# Patient Record
Sex: Male | Born: 1970 | Race: Black or African American | Hispanic: No | Smoking: Current every day smoker
Health system: Southern US, Community
[De-identification: ages and names within clinical notes are randomized; demographics above are authoritative.]

## PROBLEM LIST (undated history)

## (undated) DIAGNOSIS — E1142 Type 2 diabetes mellitus with diabetic polyneuropathy: Secondary | ICD-10-CM

## (undated) DIAGNOSIS — F5104 Psychophysiologic insomnia: Secondary | ICD-10-CM

## (undated) DIAGNOSIS — F419 Anxiety disorder, unspecified: Secondary | ICD-10-CM

## (undated) DIAGNOSIS — I10 Essential (primary) hypertension: Secondary | ICD-10-CM

## (undated) DIAGNOSIS — E785 Hyperlipidemia, unspecified: Secondary | ICD-10-CM

## (undated) DIAGNOSIS — K921 Melena: Secondary | ICD-10-CM

## (undated) DIAGNOSIS — E119 Type 2 diabetes mellitus without complications: Secondary | ICD-10-CM

## (undated) DIAGNOSIS — C801 Malignant (primary) neoplasm, unspecified: Secondary | ICD-10-CM

## (undated) DIAGNOSIS — M6281 Muscle weakness (generalized): Secondary | ICD-10-CM

## (undated) DIAGNOSIS — Z85818 Personal history of malignant neoplasm of other sites of lip, oral cavity, and pharynx: Secondary | ICD-10-CM

## (undated) DIAGNOSIS — F32A Depression, unspecified: Secondary | ICD-10-CM

## (undated) DIAGNOSIS — R413 Other amnesia: Secondary | ICD-10-CM

## (undated) DIAGNOSIS — R7989 Other specified abnormal findings of blood chemistry: Secondary | ICD-10-CM

## (undated) HISTORY — DX: Other specified abnormal findings of blood chemistry: R79.89

## (undated) HISTORY — DX: Personal history of malignant neoplasm of other sites of lip, oral cavity, and pharynx: Z85.818

## (undated) HISTORY — DX: Essential (primary) hypertension: I10

## (undated) HISTORY — DX: Other amnesia: R41.3

## (undated) HISTORY — DX: Hyperlipidemia, unspecified: E78.5

## (undated) HISTORY — PX: SKIN GRAFT: SHX250

## (undated) HISTORY — DX: Type 2 diabetes mellitus with diabetic polyneuropathy: E11.42

## (undated) HISTORY — PX: PAROTIDECTOMY: SUR1003

## (undated) HISTORY — DX: Psychophysiologic insomnia: F51.04

## (undated) HISTORY — DX: Muscle weakness (generalized): M62.81

## (undated) HISTORY — DX: Melena: K92.1

## (undated) HISTORY — PX: INCISION AND DRAINAGE DEEP NECK ABSCESS: SHX1797

---

## 2013-04-10 DIAGNOSIS — G510 Bell's palsy: Secondary | ICD-10-CM | POA: Insufficient documentation

## 2018-10-21 ENCOUNTER — Encounter (HOSPITAL_COMMUNITY): Payer: Self-pay | Admitting: Emergency Medicine

## 2018-10-21 ENCOUNTER — Emergency Department (HOSPITAL_COMMUNITY)
Admission: EM | Admit: 2018-10-21 | Discharge: 2018-10-21 | Disposition: A | Discharge status: Home / Self Care | Payer: Medicare Other | Attending: Emergency Medicine | Admitting: Emergency Medicine

## 2018-10-21 ENCOUNTER — Emergency Department (HOSPITAL_COMMUNITY): Payer: Medicare Other

## 2018-10-21 ENCOUNTER — Other Ambulatory Visit: Payer: Self-pay

## 2018-10-21 DIAGNOSIS — J101 Influenza due to other identified influenza virus with other respiratory manifestations: Secondary | ICD-10-CM

## 2018-10-21 DIAGNOSIS — Z79899 Other long term (current) drug therapy: Secondary | ICD-10-CM | POA: Insufficient documentation

## 2018-10-21 DIAGNOSIS — R509 Fever, unspecified: Secondary | ICD-10-CM | POA: Diagnosis present

## 2018-10-21 DIAGNOSIS — Z859 Personal history of malignant neoplasm, unspecified: Secondary | ICD-10-CM | POA: Diagnosis not present

## 2018-10-21 DIAGNOSIS — J111 Influenza due to unidentified influenza virus with other respiratory manifestations: Secondary | ICD-10-CM | POA: Diagnosis not present

## 2018-10-21 HISTORY — DX: Malignant (primary) neoplasm, unspecified: C80.1

## 2018-10-21 LAB — CBC WITH DIFFERENTIAL/PLATELET
Abs Immature Granulocytes: 0.03 10*3/uL (ref 0.00–0.07)
Basophils Absolute: 0 10*3/uL (ref 0.0–0.1)
Basophils Relative: 0 %
Eosinophils Absolute: 0 10*3/uL (ref 0.0–0.5)
Eosinophils Relative: 1 %
HCT: 41.7 % (ref 39.0–52.0)
Hemoglobin: 13.6 g/dL (ref 13.0–17.0)
Immature Granulocytes: 1 %
Lymphocytes Relative: 5 %
Lymphs Abs: 0.3 10*3/uL — ABNORMAL LOW (ref 0.7–4.0)
MCH: 28.8 pg (ref 26.0–34.0)
MCHC: 32.6 g/dL (ref 30.0–36.0)
MCV: 88.3 fL (ref 80.0–100.0)
Monocytes Absolute: 0.6 10*3/uL (ref 0.1–1.0)
Monocytes Relative: 10 %
Neutro Abs: 5.3 10*3/uL (ref 1.7–7.7)
Neutrophils Relative %: 83 %
Platelets: 173 10*3/uL (ref 150–400)
RBC: 4.72 MIL/uL (ref 4.22–5.81)
RDW: 12.1 % (ref 11.5–15.5)
WBC: 6.3 10*3/uL (ref 4.0–10.5)
nRBC: 0 % (ref 0.0–0.2)

## 2018-10-21 LAB — HEPATIC FUNCTION PANEL
ALT: 51 U/L — ABNORMAL HIGH (ref 0–44)
AST: 48 U/L — ABNORMAL HIGH (ref 15–41)
Albumin: 3.5 g/dL (ref 3.5–5.0)
Alkaline Phosphatase: 66 U/L (ref 38–126)
Bilirubin, Direct: 0.1 mg/dL (ref 0.0–0.2)
Indirect Bilirubin: 0.6 mg/dL (ref 0.3–0.9)
Total Bilirubin: 0.7 mg/dL (ref 0.3–1.2)
Total Protein: 6.9 g/dL (ref 6.5–8.1)

## 2018-10-21 LAB — BASIC METABOLIC PANEL
Anion gap: 9 (ref 5–15)
BUN: 9 mg/dL (ref 6–20)
CO2: 24 mmol/L (ref 22–32)
Calcium: 8.7 mg/dL — ABNORMAL LOW (ref 8.9–10.3)
Chloride: 104 mmol/L (ref 98–111)
Creatinine, Ser: 1.18 mg/dL (ref 0.61–1.24)
GFR calc Af Amer: >60 mL/min (ref >60)
GFR calc non Af Amer: >60 mL/min (ref >60)
Glucose, Bld: 206 mg/dL — ABNORMAL HIGH (ref 70–99)
Potassium: 4.1 mmol/L (ref 3.5–5.1)
Sodium: 137 mmol/L (ref 135–145)

## 2018-10-21 LAB — INFLUENZA PANEL BY PCR (TYPE A & B)
Influenza A By PCR: POSITIVE — AB
Influenza B By PCR: NEGATIVE

## 2018-10-21 LAB — GROUP A STREP BY PCR: Group A Strep by PCR: NOT DETECTED

## 2018-10-21 LAB — LIPASE, BLOOD: Lipase: 25 U/L (ref 11–51)

## 2018-10-21 MED ORDER — KETOROLAC TROMETHAMINE 30 MG/ML IJ SOLN
30.0000 mg | Freq: Once | INTRAMUSCULAR | Status: AC
  Administered 2018-10-21: 30 mg via INTRAVENOUS
  Filled 2018-10-21: qty 1

## 2018-10-21 MED ORDER — BENZONATATE 100 MG PO CAPS: 100.0000 mg | ORAL_CAPSULE | Freq: Three times a day (TID) | ORAL | 0 refills | Status: DC | PRN

## 2018-10-21 MED ORDER — SODIUM CHLORIDE 0.9 % IV BOLUS
500.0000 mL | Freq: Once | INTRAVENOUS | Status: AC
  Administered 2018-10-21: 500 mL via INTRAVENOUS

## 2018-10-21 MED ORDER — OSELTAMIVIR PHOSPHATE 75 MG PO CAPS: 75.0000 mg | ORAL_CAPSULE | Freq: Two times a day (BID) | ORAL | 0 refills | Status: DC

## 2018-10-21 MED ORDER — SODIUM CHLORIDE 0.9 % IV BOLUS
1000.0000 mL | Freq: Once | INTRAVENOUS | Status: AC
  Administered 2018-10-21: 1000 mL via INTRAVENOUS

## 2018-10-21 MED ORDER — ONDANSETRON HCL 4 MG/2ML IJ SOLN
4.0000 mg | Freq: Once | INTRAMUSCULAR | Status: AC
  Administered 2018-10-21: 4 mg via INTRAVENOUS
  Filled 2018-10-21: qty 2

## 2018-10-21 MED ORDER — ONDANSETRON 4 MG PO TBDP: 4.0000 mg | ORAL_TABLET | Freq: Three times a day (TID) | ORAL | 0 refills | Status: AC | PRN

## 2018-10-21 MED ORDER — FLUTICASONE PROPIONATE 50 MCG/ACT NA SUSP: 2.0000 | Freq: Every day | NASAL | 0 refills | Status: DC

## 2018-10-21 NOTE — ED Triage Notes (Signed)
Pt c/o sore throat, cough, sinus drainage, and chills x 2 days. Reports his of cancer but cant not recall what kind. No other PMH

## 2018-10-21 NOTE — ED Notes (Signed)
Additional complaints of lower back pain and N/V x3 that began yesterday.

## 2018-10-21 NOTE — ED Notes (Signed)
Patient transported to X-ray 

## 2018-10-21 NOTE — ED Notes (Signed)
Patient verbalizes understanding of discharge instructions. Opportunity for questioning and answers were provided. Armband removed by staff, pt discharged from ED.  

## 2018-10-21 NOTE — Discharge Instructions (Signed)
Take Tamiflu twice daily as prescribed.  Stop taking this medicine if you experience any significant side effects including nausea, vomiting, mood disturbances.  Plenty water get plenty of rest.  Gargle warm salt water and spit it out for sore throat. May also use cough drops, warm teas, etc. Take flonase to decrease nasal congestion.  Tessalon as needed for cough. You can take 600mg  ibuprofen every 6 hours as needed for fever/pain.  Take ibuprofen with food to avoid upset stomach issues.   Followup with your primary care doctor in 3-5 days for recheck of ongoing symptoms. Return to emergency department for emergent changing or worsening of symptoms such as throat tightness, facial swelling, fever not controlled by ibuprofen or Tylenol,difficulty breathing, or chest pain.

## 2018-10-21 NOTE — ED Notes (Signed)
Pt able to ingest fluids and food without complaint.

## 2018-10-21 NOTE — ED Provider Notes (Signed)
[facility] EMERGENCY DEPARTMENT Provider Note   CSN: [id number] Arrival date & time: 10/21/18  1603     History   Chief Complaint Chief Complaint  Patient presents with  . Influenza    HPI Jimmy Chandler is a 48 y.o. male presents for evaluation of acute onset, persistent flulike symptoms for 2 days.  Reports subjective fevers and chills, generalized myalgias, nasal congestion, sore throat, and cough productive of "beige "sputum.  Denies shortness of breath but does report some midline aching chest pain with cough only.  Developed nausea and vomiting and has had experienced 3 episodes of nonbloody nonbilious emesis yesterday.  None today.  Denies abdominal pain.  Does note some aching right-sided low back pain developing yesterday as well.  Denies numbness to the extremities but does report generalized weakness.  Denies bowel or bladder incontinence, saddle anesthesia, IV drug use.  Denies leg swelling, recent travel or surgery, hemoptysis, or prior history of DVT or PE.  Does have a history of cancer and states it was in his "bone", but states that he has been cancer free for several years.  Underwent treatment in [place] which included surgical excision of lymph nodes and some surgery on his jaw.  Has not been able to follow-up with an oncologist here. Has tried rest and soup with some relief. He is a nonsmoker.   The history is provided by the patient.    Past Medical History:  Diagnosis Date  . Cancer ([place])     There are no active problems to display for this patient.   History reviewed. No pertinent surgical history.      Home Medications    Prior to Admission medications   Medication Sig Start Date End Date Taking? Authorizing Provider  benzonatate (TESSALON) 100 MG capsule Take 1 capsule (100 mg total) by mouth 3 (three) times daily as needed for cough. 10/21/18   Jen Benedict A, PA-C  fluticasone (FLONASE) 50 MCG/ACT nasal spray Place 2 sprays into  both nostrils daily. 10/21/18   Nils Flack, Asmi Fugere A, PA-C  ondansetron (ZOFRAN ODT) 4 MG disintegrating tablet Take 1 tablet (4 mg total) by mouth every 8 (eight) hours as needed for up to 3 days for nausea or vomiting. 10/21/18 10/24/18  Rodell Perna A, PA-C  oseltamivir (TAMIFLU) 75 MG capsule Take 1 capsule (75 mg total) by mouth every 12 (twelve) hours. 10/21/18   Renita Papa, PA-C    Family History No family history on file.  Social History Social History   Tobacco Use  . Smoking status: Not on file  Substance Use Topics  . Alcohol use: Not on file  . Drug use: Not on file     Allergies   Patient has no known allergies.   Review of Systems Review of Systems  Constitutional: Positive for chills, fatigue and fever.  HENT: Positive for congestion, rhinorrhea and sore throat.   Respiratory: Positive for cough. Negative for shortness of breath.   Cardiovascular: Negative for chest pain (with cough only).  Gastrointestinal: Positive for nausea and vomiting. Negative for abdominal pain, constipation and diarrhea.  Genitourinary: Negative for dysuria, frequency, hematuria and urgency.  Musculoskeletal: Positive for myalgias.  All other systems reviewed and are negative.    Physical Exam Updated Vital Signs BP (!) 130/58   Pulse (!) 101   Temp 99.8 F (37.7 C) (Oral)   Resp 18   Ht 5\' 9"  (1.753 m)   Wt 99.8 kg   SpO2 99%  BMI 32.49 kg/m   Physical Exam Vitals signs and nursing note reviewed.  Constitutional:      General: He is not in acute distress.    Appearance: Normal appearance. He is well-developed.  HENT:     Head: Normocephalic and atraumatic.     Right Ear: Tympanic membrane, ear canal and external ear normal.     Left Ear: Tympanic membrane, ear canal and external ear normal.     Nose: Congestion present.     Comments: No frontal or maxillary sinus tenderness    Mouth/Throat:     Mouth: Mucous membranes are moist.     Pharynx: No oropharyngeal exudate or  posterior oropharyngeal erythema.     Comments: Postsurgical changes noted.  Tolerating secretions without difficulty. Eyes:     General:        Right eye: No discharge.        Left eye: No discharge.     Conjunctiva/sclera: Conjunctivae normal.  Neck:     Musculoskeletal: Normal range of motion and neck supple. No neck rigidity or muscular tenderness.     Vascular: No JVD.     Trachea: No tracheal deviation.     Comments: Well-healed surgical scar from prior lymphadenectomy to left anterior neck Cardiovascular:     Rate and Rhythm: Tachycardia present.     Pulses: Normal pulses.     Heart sounds: Normal heart sounds.     Comments: 2+ radial and DP/PT pulses bilaterally, Homans sign absent bilaterally, no lower extremity edema, no palpable cords, compartments are soft  Pulmonary:     Effort: Pulmonary effort is normal. No respiratory distress.     Breath sounds: Normal breath sounds. No wheezing.  Abdominal:     General: Abdomen is flat. Bowel sounds are normal. There is no distension.     Tenderness: There is no abdominal tenderness. There is no right CVA tenderness, left CVA tenderness, guarding or rebound.  Musculoskeletal: Normal range of motion.     Comments: No midline spine tenderness, right paralumbar muscle tenderness.  No deformity, crepitus, or step-off noted.  5/5 strength of BUE and BLE major muscle groups  Lymphadenopathy:     Cervical: No cervical adenopathy.  Skin:    General: Skin is warm and dry.     Findings: No erythema.  Neurological:     General: No focal deficit present.     Mental Status: He is alert.     Comments: Fluent speech, no facial droop, sensation intact to bilateral lower extremities  Psychiatric:        Behavior: Behavior normal.      ED Treatments / Results  Labs (all labs ordered are listed, but only abnormal results are displayed) Labs Reviewed  BASIC METABOLIC PANEL - Abnormal; Notable for the following components:      Result Value    Glucose, Bld 206 (*)    Calcium 8.7 (*)    All other components within normal limits  CBC WITH DIFFERENTIAL/PLATELET - Abnormal; Notable for the following components:   Lymphs Abs 0.3 (*)    All other components within normal limits  INFLUENZA PANEL BY PCR (TYPE A & B) - Abnormal; Notable for the following components:   Influenza A By PCR POSITIVE (*)    All other components within normal limits  HEPATIC FUNCTION PANEL - Abnormal; Notable for the following components:   AST 48 (*)    ALT 51 (*)    All other components within normal limits  GROUP  A STREP BY PCR  LIPASE, BLOOD    EKG None  Radiology Dg Chest 2 View  Result Date: 10/21/2018 CLINICAL DATA:  Chest pain, productive cough. EXAM: CHEST - 2 VIEW COMPARISON:  None. FINDINGS: The heart size and mediastinal contours are within normal limits. Both lungs are clear. No pneumothorax or pleural effusion is noted. The visualized skeletal structures are unremarkable. IMPRESSION: No active cardiopulmonary disease. Electronically Signed   By: Marijo Conception, M.D.   On: 10/21/2018 18:47    Procedures Procedures (including critical care time)  Medications Ordered in ED Medications  sodium chloride 0.9 % bolus 1,000 mL (0 mLs Intravenous Stopped 10/21/18 2232)  ondansetron (ZOFRAN) injection 4 mg (4 mg Intravenous Given 10/21/18 1808)  sodium chloride 0.9 % bolus 500 mL (0 mLs Intravenous Stopped 10/21/18 2232)  ketorolac (TORADOL) 30 MG/ML injection 30 mg (30 mg Intravenous Given 10/21/18 2158)     Initial Impression / Assessment and Plan / ED Course  I have reviewed the triage vital signs and the nursing notes.  Pertinent labs & imaging results that were available during my care of the patient were reviewed by me and considered in my medical decision making (see chart for details).     Patient presenting for evaluation of flulike symptoms for 2 days.  He is afebrile, initially tachycardic with improvement on reevaluation.  Overall  resting comfortably.  Abdomen is soft and nontender.  Lab work reviewed by me shows no leukocytosis, no anemia, no metabolic derangements.  He is hyperglycemic with a glucose of 206.  His LFTs are mildly elevated however he has no right upper quadrant tenderness on examination so I am unsure of the clinical significance.  Doubt acute surgical abdominal pathology given benign examination.  He is positive for influenza A.  Chest x-ray shows no acute cardiopulmonary abnormalities, no evidence of pneumonia or pleural effusion.  He was given IV fluids and Zofran in the ED and on reevaluation is resting comfortably in no apparent distress.  Serial abdominal examinations remain benign.  Doubt PE, ACS/MI, dissection, or pneumothorax.  Symptoms more infectious in etiology.  Discussed that he is within the window to receive Tamiflu and he would like to proceed with Tamiflu treatment.  Discussed side effects risks and benefits and possible expense of the medication.  He verbalized understanding of and agreement with this and would like to proceed with Tamiflu treatment.  Will discharge with symptomatic treatment as well.  Recommend follow-up with PCP for reevaluation of symptoms.  Discussed strict ED return precautions. Pt verbalized understanding of and agreement with plan and is safe for discharge home at this time.   Final Clinical Impressions(s) / ED Diagnoses   Final diagnoses:  Influenza A    ED Discharge Orders         Ordered    oseltamivir (TAMIFLU) 75 MG capsule  Every 12 hours     10/21/18 2235    ondansetron (ZOFRAN ODT) 4 MG disintegrating tablet  Every 8 hours PRN     10/21/18 2235    fluticasone (FLONASE) 50 MCG/ACT nasal spray  Daily     10/21/18 2235    benzonatate (TESSALON) 100 MG capsule  3 times daily PRN     10/21/18 2235           Renita Papa, PA-C 10/21/18 2257    Carmin Muskrat, MD 10/22/18 2313

## 2019-03-09 ENCOUNTER — Other Ambulatory Visit: Payer: Self-pay

## 2019-03-09 ENCOUNTER — Emergency Department (HOSPITAL_COMMUNITY)
Admission: EM | Admit: 2019-03-09 | Discharge: 2019-03-10 | Disposition: A | Discharge status: Home / Self Care | Payer: Medicare PPO | Attending: Emergency Medicine | Admitting: Emergency Medicine

## 2019-03-09 ENCOUNTER — Encounter (HOSPITAL_COMMUNITY): Payer: Self-pay | Admitting: Emergency Medicine

## 2019-03-09 ENCOUNTER — Emergency Department (HOSPITAL_COMMUNITY): Payer: Medicare PPO

## 2019-03-09 DIAGNOSIS — R1084 Generalized abdominal pain: Secondary | ICD-10-CM

## 2019-03-09 DIAGNOSIS — R079 Chest pain, unspecified: Secondary | ICD-10-CM | POA: Diagnosis present

## 2019-03-09 DIAGNOSIS — E119 Type 2 diabetes mellitus without complications: Secondary | ICD-10-CM | POA: Diagnosis not present

## 2019-03-09 DIAGNOSIS — R0789 Other chest pain: Secondary | ICD-10-CM | POA: Diagnosis not present

## 2019-03-09 LAB — HEPATIC FUNCTION PANEL
ALT: 52 U/L — ABNORMAL HIGH (ref 0–44)
AST: 40 U/L (ref 15–41)
Albumin: 3.5 g/dL (ref 3.5–5.0)
Alkaline Phosphatase: 91 U/L (ref 38–126)
Bilirubin, Direct: 0.2 mg/dL (ref 0.0–0.2)
Indirect Bilirubin: 0.6 mg/dL (ref 0.3–0.9)
Total Bilirubin: 0.8 mg/dL (ref 0.3–1.2)
Total Protein: 6.5 g/dL (ref 6.5–8.1)

## 2019-03-09 LAB — LIPASE, BLOOD: Lipase: 30 U/L (ref 11–51)

## 2019-03-09 LAB — BASIC METABOLIC PANEL
Anion gap: 11 (ref 5–15)
BUN: 8 mg/dL (ref 6–20)
CO2: 23 mmol/L (ref 22–32)
Calcium: 8.9 mg/dL (ref 8.9–10.3)
Chloride: 96 mmol/L — ABNORMAL LOW (ref 98–111)
Creatinine, Ser: 0.98 mg/dL (ref 0.61–1.24)
GFR calc Af Amer: >60 mL/min (ref >60)
GFR calc non Af Amer: >60 mL/min (ref >60)
Glucose, Bld: 403 mg/dL — ABNORMAL HIGH (ref 70–99)
Potassium: 3.8 mmol/L (ref 3.5–5.1)
Sodium: 130 mmol/L — ABNORMAL LOW (ref 135–145)

## 2019-03-09 LAB — CBC
HCT: 41.1 % (ref 39.0–52.0)
Hemoglobin: 13.9 g/dL (ref 13.0–17.0)
MCH: 29.4 pg (ref 26.0–34.0)
MCHC: 33.8 g/dL (ref 30.0–36.0)
MCV: 87.1 fL (ref 80.0–100.0)
Platelets: 227 10*3/uL (ref 150–400)
RBC: 4.72 MIL/uL (ref 4.22–5.81)
RDW: 11.4 % — ABNORMAL LOW (ref 11.5–15.5)
WBC: 5.6 10*3/uL (ref 4.0–10.5)
nRBC: 0 % (ref 0.0–0.2)

## 2019-03-09 LAB — TROPONIN I (HIGH SENSITIVITY)
Troponin I (High Sensitivity): 7 ng/L (ref <18)
Troponin I (High Sensitivity): 5 ng/L (ref <18)

## 2019-03-09 LAB — D-DIMER, QUANTITATIVE: D-Dimer, Quant: <0.27 ug/mL-FEU (ref 0.00–0.50)

## 2019-03-09 MED ORDER — SODIUM CHLORIDE 0.9 % IV BOLUS
1000.0000 mL | Freq: Once | INTRAVENOUS | Status: AC
  Administered 2019-03-09: 1000 mL via INTRAVENOUS

## 2019-03-09 MED ORDER — SODIUM CHLORIDE 0.9% FLUSH
3.0000 mL | Freq: Once | INTRAVENOUS | Status: AC
  Administered 2019-03-09: 3 mL via INTRAVENOUS

## 2019-03-09 NOTE — ED Provider Notes (Signed)
[facility] EMERGENCY DEPARTMENT Provider Note   CSN: [id number] Arrival date & time: 03/09/19  1736    History   Chief Complaint Chief Complaint  Patient presents with  . Chest Pain    HPI Jimmy Chandler is a 48 y.o. male with a past medical history of neck cancer who presents today for evaluation of multiple complaints. 1.  Chest pain he reports central, waxing and waning chest pain over the past 3 to 4 days.  He denies any radiation.  No aggravating or alleviating factors noted.  He has not had similar pain before.  He reports sore throat and occasional cough with shortness of breath.  He denies any fevers or chills.  No known sick contacts.  He denies any palpitations.  He reports that he feels like both of his legs are slightly swollen symmetrically.  No recent hemoptysis.    2.  Abdominal pain, nausea, vomiting, diarrhea: He reports that over the past 2 days he has had abdominal pain that comes and goes.  He is not having any pain unless his abdomen is pressed on.  He reports that he has not had any nausea or vomiting today.  He states that yesterday he vomited 2 or 3 times.  Today he has had 1 loose stool.  He denies any blood in his vomit or bowel movement.  He states that he has been able to eat and drink.  No dysuria, increased frequency or urgency.      HPI  Past Medical History:  Diagnosis Date  . Cancer ([place])     There are no active problems to display for this patient.   History reviewed. No pertinent surgical history.      Home Medications    Prior to Admission medications   Medication Sig Start Date End Date Taking? Authorizing Provider  benzonatate (TESSALON) 100 MG capsule Take 1 capsule (100 mg total) by mouth 3 (three) times daily as needed for cough. Patient not taking: Reported on 03/09/2019 10/21/18   Rodell Perna A, PA-C  fluticasone (FLONASE) 50 MCG/ACT nasal spray Place 2 sprays into both nostrils daily. Patient not taking: Reported  on 03/09/2019 10/21/18   Rodell Perna A, PA-C  metFORMIN (GLUCOPHAGE) 500 MG tablet Take 1 tablet (500 mg total) by mouth 2 (two) times daily with a meal for 30 days. 03/10/19 04/09/19  Lorin Glass, PA-C  oseltamivir (TAMIFLU) 75 MG capsule Take 1 capsule (75 mg total) by mouth every 12 (twelve) hours. Patient not taking: Reported on 03/09/2019 10/21/18   Renita Papa, PA-C    Family History No family history on file.  Social History Social History   Tobacco Use  . Smoking status: Never Smoker  . Smokeless tobacco: Never Used  Substance Use Topics  . Alcohol use: Yes  . Drug use: Never     Allergies   Patient has no known allergies.   Review of Systems Review of Systems  Constitutional: Negative for chills and fever.  HENT: Positive for mouth sores. Negative for ear pain and sore throat.   Eyes: Negative for pain and visual disturbance.  Respiratory: Positive for cough and shortness of breath.   Cardiovascular: Positive for chest pain and leg swelling. Negative for palpitations.  Gastrointestinal: Positive for abdominal pain, diarrhea, nausea and vomiting.  Musculoskeletal: Negative for arthralgias and back pain.  Skin: Negative for color change and rash.  Neurological: Negative for weakness and headaches.  All other systems reviewed and are negative.  Physical Exam Updated Vital Signs BP 112/77   Pulse 80   Temp 97.8 F (36.6 C) (Oral)   Resp 15   SpO2 96%   Physical Exam Vitals signs and nursing note reviewed.  Constitutional:      General: He is not in acute distress.    Appearance: He is well-developed. He is not diaphoretic.  HENT:     Head: Normocephalic and atraumatic.  Eyes:     General: No scleral icterus.       Right eye: No discharge.        Left eye: No discharge.     Conjunctiva/sclera: Conjunctivae normal.  Neck:     Musculoskeletal: Normal range of motion.  Cardiovascular:     Rate and Rhythm: Normal rate and regular rhythm.      Pulses:          Radial pulses are 2+ on the right side and 2+ on the left side.       Dorsalis pedis pulses are 1+ on the right side and 2+ on the left side.     Heart sounds: Normal heart sounds. No murmur.  Pulmonary:     Effort: Pulmonary effort is normal. No respiratory distress.     Breath sounds: Normal breath sounds. No stridor. No decreased breath sounds or wheezing.  Chest:     Chest wall: No mass, deformity, tenderness or crepitus.  Abdominal:     General: Bowel sounds are normal. There is no distension.     Palpations: Abdomen is soft.     Tenderness: There is abdominal tenderness in the left lower quadrant.  Musculoskeletal:        General: No deformity.  Skin:    General: Skin is warm and dry.  Neurological:     General: No focal deficit present.     Mental Status: He is alert.     Motor: No abnormal muscle tone.  Psychiatric:        Mood and Affect: Mood normal.        Behavior: Behavior normal.      ED Treatments / Results  Labs (all labs ordered are listed, but only abnormal results are displayed) Labs Reviewed  BASIC METABOLIC PANEL - Abnormal; Notable for the following components:      Result Value   Sodium 130 (*)    Chloride 96 (*)    Glucose, Bld 403 (*)    All other components within normal limits  CBC - Abnormal; Notable for the following components:   RDW 11.4 (*)    All other components within normal limits  HEPATIC FUNCTION PANEL - Abnormal; Notable for the following components:   ALT 52 (*)    All other components within normal limits  TROPONIN I (HIGH SENSITIVITY)  TROPONIN I (HIGH SENSITIVITY)  LIPASE, BLOOD  D-DIMER, QUANTITATIVE (NOT AT [facility])  URINALYSIS, ROUTINE W REFLEX MICROSCOPIC    EKG None  Radiology Dg Chest 2 View  Result Date: 03/09/2019 CLINICAL DATA:  Chest pain EXAM: CHEST - 2 VIEW COMPARISON:  10/21/2018 FINDINGS: The heart size and mediastinal contours are within normal limits. Both lungs are clear. The visualized  skeletal structures are unremarkable. IMPRESSION: No active cardiopulmonary disease. Electronically Signed   By: Constance Holster M.D.   On: 03/09/2019 19:18    Procedures Procedures (including critical care time)  Medications Ordered in ED Medications  sodium chloride flush (NS) 0.9 % injection 3 mL (3 mLs Intravenous Given 03/09/19 2346)  sodium chloride 0.9 %  bolus 1,000 mL (1,000 mLs Intravenous New Bag/Given 03/09/19 2346)     Initial Impression / Assessment and Plan / ED Course  I have reviewed the triage vital signs and the nursing notes.  Pertinent labs & imaging results that were available during my care of the patient were reviewed by me and considered in my medical decision making (see chart for details).       Patient presents today for evaluation of multiple complaints. 1.  Chest pain.  His chest pain has been going on for 4 days and is in the middle of his chest.  High-sensitivity troponin is not elevated, EKG does not show evidence of ischemia.  X-ray without evidence of acute abnormality.  Do not suspect ACS.  He was not slightly tachycardic on arrival, d-dimer was obtained which was not elevated, suspect GERD versus musculoskeletal pain.  2 abdominal pain, nausea, vomiting, diarrhea.  Labs are obtained and reviewed.  He does have mild abdominal tenderness however refused recommended CT scan of his abdomen after being given enough information to make an informed decision about his health.    ALT is slightly elevated at 52, lipase is not elevated.  Suspect gastroenteritis, most likely viral however this is limited based on absence of CT scan.  3.  New diagnosis of diabetes Patient's sugar was 403 today.  He has previously been told that he was prediabetic however he is informed of the new diagnosis of diabetes.  We will start him on metformin 500 mg twice daily.  He is given a scription for 30 days worth.  He does not have a primary care doctor, and the importance of  obtaining appropriate primary care doctor and following up was emphasized to patient.  He is given 1 L of IV fluids.  His CO2 is not significantly low, do not suspect DKA. He has Medicare so he should not have significant difficulty in finding a primary care doctor.  This patient was seen as a shared visit with Dr. Kathrynn Humble.  At shift change care was transferred to Quincy Carnes PA-C who will follow pending studies, re-evaulate and determine disposition.      Final Clinical Impressions(s) / ED Diagnoses   Final diagnoses:  Atypical chest pain  Generalized abdominal pain  Diabetes mellitus, new onset [facility])    ED Discharge Orders         Ordered    metFORMIN (GLUCOPHAGE) 500 MG tablet  2 times daily with meals     03/10/19 0005           Lorin Glass, PA-C 03/10/19 0037    Varney Biles, MD 03/10/19 1228

## 2019-03-09 NOTE — ED Triage Notes (Signed)
Patient reports intermittent central, non-radiating chest pain and sore throat since last night. Denies shortness of breath, dizziness, N/V, cough, fevers/chills. Resp e/u, skin w/d.

## 2019-03-10 DIAGNOSIS — R0789 Other chest pain: Secondary | ICD-10-CM | POA: Diagnosis not present

## 2019-03-10 LAB — URINALYSIS, ROUTINE W REFLEX MICROSCOPIC
Bacteria, UA: NONE SEEN
Bilirubin Urine: NEGATIVE
Glucose, UA: >=500 mg/dL — AB
Hgb urine dipstick: NEGATIVE
Ketones, ur: 5 mg/dL — AB
Leukocytes,Ua: NEGATIVE
Nitrite: NEGATIVE
Protein, ur: NEGATIVE mg/dL
Specific Gravity, Urine: 1.035 — ABNORMAL HIGH (ref 1.005–1.030)
pH: 5 (ref 5.0–8.0)

## 2019-03-10 LAB — CBG MONITORING, ED: Glucose-Capillary: 250 mg/dL — ABNORMAL HIGH (ref 70–99)

## 2019-03-10 MED ORDER — LIDOCAINE VISCOUS HCL 2 % MT SOLN
15.0000 mL | Freq: Once | OROMUCOSAL | Status: AC
  Administered 2019-03-10: 15 mL via ORAL
  Filled 2019-03-10: qty 15

## 2019-03-10 MED ORDER — METFORMIN HCL 500 MG PO TABS: 500.0000 mg | ORAL_TABLET | Freq: Two times a day (BID) | ORAL | 0 refills | Status: DC

## 2019-03-10 MED ORDER — ALUM & MAG HYDROXIDE-SIMETH 200-200-20 MG/5ML PO SUSP
30.0000 mL | Freq: Once | ORAL | Status: AC
  Administered 2019-03-10: 30 mL via ORAL
  Filled 2019-03-10: qty 30

## 2019-03-10 NOTE — ED Notes (Signed)
Patient verbalizes understanding of discharge instructions. Opportunity for questioning and answers were provided. Armband removed by staff, pt discharged from ED.  

## 2019-03-10 NOTE — Discharge Instructions (Signed)
As we discussed today your blood sugar was significantly elevated meaning you have a new diagnosis of diabetes.  I have given you information about diabetes and nutrition, and diabetes and exercise.  It is important that you establish care with a primary care doctor as soon as possible for further evaluation and management.  I have given you a prescription today for metformin.  This is a medicine that can help lower your blood sugar.  Abdominal pain, diarrhea are to very common side effects.    If your chest pain changes or worsens or you have additional concerns please seek additional medical care and evaluation.

## 2019-03-10 NOTE — ED Provider Notes (Signed)
Assumed care from [place] at shift change.  Briefly, 48 year old male here with multiple concerns.  Chest pain, abdominal pain, new onset diabetes.  Labs overall reassuring.  He was offered CT scan and refused.  Cardiac work-up was negative including troponin and d-dimer.  CBG here in the 400s.  He was given IV fluids.  Plan: IV fluids infusing, recheck CBG afterwards.  Prescriptions and discharge paperwork prepared for patient pending reassessment.  Results for orders placed or performed during the hospital encounter of 40/10/27  Basic metabolic panel  Result Value Ref Range   Sodium 130 (L) 135 - 145 mmol/L   Potassium 3.8 3.5 - 5.1 mmol/L   Chloride 96 (L) 98 - 111 mmol/L   CO2 23 22 - 32 mmol/L   Glucose, Bld 403 (H) 70 - 99 mg/dL   BUN 8 6 - 20 mg/dL   Creatinine, Ser 0.98 0.61 - 1.24 mg/dL   Calcium 8.9 8.9 - 10.3 mg/dL   GFR calc non Af Amer >60 >60 mL/min   GFR calc Af Amer >60 >60 mL/min   Anion gap 11 5 - 15  CBC  Result Value Ref Range   WBC 5.6 4.0 - 10.5 K/uL   RBC 4.72 4.22 - 5.81 MIL/uL   Hemoglobin 13.9 13.0 - 17.0 g/dL   HCT 41.1 39.0 - 52.0 %   MCV 87.1 80.0 - 100.0 fL   MCH 29.4 26.0 - 34.0 pg   MCHC 33.8 30.0 - 36.0 g/dL   RDW 11.4 (L) 11.5 - 15.5 %   Platelets 227 150 - 400 K/uL   nRBC 0.0 0.0 - 0.2 %  Troponin I (High Sensitivity)  Result Value Ref Range   Troponin I (High Sensitivity) 7 <18 ng/L  Troponin I (High Sensitivity)  Result Value Ref Range   Troponin I (High Sensitivity) 5 <18 ng/L  Hepatic function panel  Result Value Ref Range   Total Protein 6.5 6.5 - 8.1 g/dL   Albumin 3.5 3.5 - 5.0 g/dL   AST 40 15 - 41 U/L   ALT 52 (H) 0 - 44 U/L   Alkaline Phosphatase 91 38 - 126 U/L   Total Bilirubin 0.8 0.3 - 1.2 mg/dL   Bilirubin, Direct 0.2 0.0 - 0.2 mg/dL   Indirect Bilirubin 0.6 0.3 - 0.9 mg/dL  Lipase, blood  Result Value Ref Range   Lipase 30 11 - 51 U/L  D-dimer, quantitative (not at [facility])  Result Value Ref Range   D-Dimer, Quant  <0.27 0.00 - 0.50 ug/mL-FEU  Urinalysis, Routine w reflex microscopic  Result Value Ref Range   Color, Urine YELLOW YELLOW   APPearance CLEAR CLEAR   Specific Gravity, Urine 1.035 (H) 1.005 - 1.030   pH 5.0 5.0 - 8.0   Glucose, UA >=500 (A) NEGATIVE mg/dL   Hgb urine dipstick NEGATIVE NEGATIVE   Bilirubin Urine NEGATIVE NEGATIVE   Ketones, ur 5 (A) NEGATIVE mg/dL   Protein, ur NEGATIVE NEGATIVE mg/dL   Nitrite NEGATIVE NEGATIVE   Leukocytes,Ua NEGATIVE NEGATIVE   RBC / HPF 0-5 0 - 5 RBC/hpf   WBC, UA 0-5 0 - 5 WBC/hpf   Bacteria, UA NONE SEEN NONE SEEN  POC CBG, ED  Result Value Ref Range   Glucose-Capillary 250 (H) 70 - 99 mg/dL   Dg Chest 2 View  Result Date: 03/09/2019 CLINICAL DATA:  Chest pain EXAM: CHEST - 2 VIEW COMPARISON:  10/21/2018 FINDINGS: The heart size and mediastinal contours are within normal limits. Both  lungs are clear. The visualized skeletal structures are unremarkable. IMPRESSION: No active cardiopulmonary disease. Electronically Signed   By: Constance Holster M.D.   On: 03/09/2019 19:18     1:57 AM CBG after IV fluids is decreased down to 250.  His CO2 is normal and minimal ketones present in the urine.  This is not consistent with DKA.  Feel patient is stable for discharge home with prescriptions as per PA Phylliss Bob, start metformin.  Will need close follow-up with PCP.  Return here for any new or acute changes.   Larene Pickett, PA-C 03/10/19 2778    Ezequiel Essex, MD 03/10/19 0930

## 2019-04-13 ENCOUNTER — Emergency Department (HOSPITAL_COMMUNITY): Payer: Medicare PPO

## 2019-04-13 ENCOUNTER — Emergency Department (HOSPITAL_COMMUNITY)
Admission: EM | Admit: 2019-04-13 | Discharge: 2019-04-14 | Disposition: A | Discharge status: Home / Self Care | Payer: Medicare PPO | Attending: Emergency Medicine | Admitting: Emergency Medicine

## 2019-04-13 ENCOUNTER — Encounter (HOSPITAL_COMMUNITY): Payer: Self-pay

## 2019-04-13 ENCOUNTER — Other Ambulatory Visit: Payer: Self-pay

## 2019-04-13 DIAGNOSIS — E11628 Type 2 diabetes mellitus with other skin complications: Secondary | ICD-10-CM | POA: Insufficient documentation

## 2019-04-13 DIAGNOSIS — Z85828 Personal history of other malignant neoplasm of skin: Secondary | ICD-10-CM | POA: Diagnosis not present

## 2019-04-13 DIAGNOSIS — L0889 Other specified local infections of the skin and subcutaneous tissue: Secondary | ICD-10-CM | POA: Diagnosis not present

## 2019-04-13 DIAGNOSIS — L089 Local infection of the skin and subcutaneous tissue, unspecified: Secondary | ICD-10-CM

## 2019-04-13 HISTORY — DX: Type 2 diabetes mellitus without complications: E11.9

## 2019-04-13 LAB — BASIC METABOLIC PANEL
Anion gap: 13 (ref 5–15)
BUN: 18 mg/dL (ref 6–20)
CO2: 19 mmol/L — ABNORMAL LOW (ref 22–32)
Calcium: 8.8 mg/dL — ABNORMAL LOW (ref 8.9–10.3)
Chloride: 98 mmol/L (ref 98–111)
Creatinine, Ser: 1.12 mg/dL (ref 0.61–1.24)
GFR calc Af Amer: >60 mL/min (ref >60)
GFR calc non Af Amer: >60 mL/min (ref >60)
Glucose, Bld: 360 mg/dL — ABNORMAL HIGH (ref 70–99)
Potassium: 3.9 mmol/L (ref 3.5–5.1)
Sodium: 130 mmol/L — ABNORMAL LOW (ref 135–145)

## 2019-04-13 LAB — CBC
HCT: 42.9 % (ref 39.0–52.0)
Hemoglobin: 14.5 g/dL (ref 13.0–17.0)
MCH: 30 pg (ref 26.0–34.0)
MCHC: 33.8 g/dL (ref 30.0–36.0)
MCV: 88.6 fL (ref 80.0–100.0)
Platelets: 198 10*3/uL (ref 150–400)
RBC: 4.84 MIL/uL (ref 4.22–5.81)
RDW: 11.5 % (ref 11.5–15.5)
WBC: 5.3 10*3/uL (ref 4.0–10.5)
nRBC: 0 % (ref 0.0–0.2)

## 2019-04-13 LAB — URINALYSIS, ROUTINE W REFLEX MICROSCOPIC
Bacteria, UA: NONE SEEN
Bilirubin Urine: NEGATIVE
Glucose, UA: >=500 mg/dL — AB
Hgb urine dipstick: NEGATIVE
Ketones, ur: NEGATIVE mg/dL
Leukocytes,Ua: NEGATIVE
Nitrite: NEGATIVE
Protein, ur: NEGATIVE mg/dL
Specific Gravity, Urine: 1.03 (ref 1.005–1.030)
pH: 5 (ref 5.0–8.0)

## 2019-04-13 LAB — CBG MONITORING, ED: Glucose-Capillary: 319 mg/dL — ABNORMAL HIGH (ref 70–99)

## 2019-04-13 NOTE — ED Triage Notes (Signed)
Pt reports diabetic wound to right big toe. No drainage noted, no redness or swelling. Pt unable to check CBGs at home.

## 2019-04-13 NOTE — ED Notes (Signed)
Patient transported to X-ray 

## 2019-04-14 DIAGNOSIS — E11628 Type 2 diabetes mellitus with other skin complications: Secondary | ICD-10-CM | POA: Diagnosis not present

## 2019-04-14 LAB — CBG MONITORING, ED: Glucose-Capillary: 287 mg/dL — ABNORMAL HIGH (ref 70–99)

## 2019-04-14 MED ORDER — AMOXICILLIN-POT CLAVULANATE 875-125 MG PO TABS: 1.0000 | ORAL_TABLET | Freq: Two times a day (BID) | ORAL | 0 refills | Status: DC

## 2019-04-14 MED ORDER — SODIUM CHLORIDE 0.9 % IV BOLUS
1000.0000 mL | Freq: Once | INTRAVENOUS | Status: AC
  Administered 2019-04-14: 1000 mL via INTRAVENOUS

## 2019-04-14 MED ORDER — DOXYCYCLINE HYCLATE 100 MG PO CAPS: 100.0000 mg | ORAL_CAPSULE | Freq: Two times a day (BID) | ORAL | 0 refills | Status: DC

## 2019-04-14 MED ORDER — AMOXICILLIN-POT CLAVULANATE 875-125 MG PO TABS
1.0000 | ORAL_TABLET | Freq: Once | ORAL | Status: AC
  Administered 2019-04-14: 01:00:00 1 via ORAL
  Filled 2019-04-14: qty 1

## 2019-04-14 MED ORDER — DOXYCYCLINE HYCLATE 100 MG PO TABS
100.0000 mg | ORAL_TABLET | Freq: Once | ORAL | Status: AC
  Administered 2019-04-14: 100 mg via ORAL
  Filled 2019-04-14: qty 1

## 2019-04-14 NOTE — Discharge Instructions (Signed)
Take antibiotics as prescribed.  Watch her blood sugars closely and take your metformin as prescribed.  Follow-up with the foot center for an appointment early next week.  Return to the ED with worsening pain, fever, vomiting, bleeding, drainage, streaking redness, or any other concerns.

## 2019-04-14 NOTE — ED Notes (Signed)
Patient verbalizes understanding of discharge instructions. Opportunity for questioning and answers were provided. Armband removed by staff, pt discharged from ED.  

## 2019-04-14 NOTE — ED Provider Notes (Signed)
[facility] EMERGENCY DEPARTMENT Provider Note   CSN: [id number] Arrival date & time: 04/13/19  2059     History   Chief Complaint Chief Complaint  Patient presents with  . Foot Ulcer    HPI Jimmy Chandler is a 48 y.o. male.     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  He is uncertain how it happened.  He noticed some discoloration and swelling to his right great toe 2 days ago that is minimally painful.  This causes irritation in his shoe.  There is no fever chills, nausea or vomiting.  There is no abdominal pain, chest pain or shortness of breath.  He states he takes his metformin as prescribed and checks his sugars occasionally. He denies any known trauma.  The history is provided by the patient.    Past Medical History:  Diagnosis Date  . Cancer ([place])   . Diabetes mellitus without complication ([place])     There are no active problems to display for this patient.   History reviewed. No pertinent surgical history.      Home Medications    Prior to Admission medications   Medication Sig Start Date End Date Taking? Authorizing Provider  benzonatate (TESSALON) 100 MG capsule Take 1 capsule (100 mg total) by mouth 3 (three) times daily as needed for cough. Patient not taking: Reported on 03/09/2019 10/21/18   Rodell Perna A, PA-C  fluticasone (FLONASE) 50 MCG/ACT nasal spray Place 2 sprays into both nostrils daily. Patient not taking: Reported on 03/09/2019 10/21/18   Rodell Perna A, PA-C  metFORMIN (GLUCOPHAGE) 500 MG tablet Take 1 tablet (500 mg total) by mouth 2 (two) times daily with a meal for 30 days. 03/10/19 04/09/19  Lorin Glass, PA-C  oseltamivir (TAMIFLU) 75 MG capsule Take 1 capsule (75 mg total) by mouth every 12 (twelve) hours. Patient not taking: Reported on 03/09/2019 10/21/18    Renita Papa, PA-C    Family History No family history on file.  Social History Social History   Tobacco Use  . Smoking status: Never Smoker  . Smokeless tobacco: Never Used  Substance Use Topics  . Alcohol use: Yes  . Drug use: Never     Allergies   Patient has no known allergies.   Review of Systems Review of Systems  Constitutional: Negative for activity change, appetite change and fever.  HENT: Negative for congestion and rhinorrhea.   Eyes: Negative for visual disturbance.  Respiratory: Negative for cough, chest tightness and shortness of breath.   Cardiovascular: Negative for chest pain and leg swelling.  Gastrointestinal: Negative for abdominal pain, nausea and vomiting.  Genitourinary: Negative for dysuria and hematuria.  Musculoskeletal: Positive for arthralgias and myalgias.  Skin: Positive for wound.  Neurological: Negative for dizziness, weakness and headaches.     all other systems are negative except as noted in the HPI and PMH.   Physical Exam Updated Vital Signs BP 126/83 (BP Location: Left Arm)   Pulse (!) 106   Temp 98.9 F (37.2 C) (Oral)   Resp 18   SpO2 98%   Physical Exam Vitals signs and nursing note reviewed.  Constitutional:      General: He is not in acute distress.    Appearance: He is well-developed.  HENT:     Head: Normocephalic and atraumatic.  Comments: Prior facial surgery with resultant right facial droop    Mouth/Throat:     Pharynx: No oropharyngeal exudate.  Eyes:     Conjunctiva/sclera: Conjunctivae normal.     Pupils: Pupils are equal, round, and reactive to light.  Neck:     Musculoskeletal: Normal range of motion and neck supple.     Comments: No meningismus. Cardiovascular:     Rate and Rhythm: Normal rate and regular rhythm.     Heart sounds: Normal heart sounds. No murmur.  Pulmonary:     Effort: Pulmonary effort is normal. No respiratory distress.     Breath sounds: Normal breath sounds.  Chest:      Chest wall: No tenderness.  Abdominal:     Palpations: Abdomen is soft.     Tenderness: There is no abdominal tenderness. There is no guarding or rebound.  Musculoskeletal: Normal range of motion.        General: Tenderness and signs of injury present.     Comments: Right great toe with firm area of callus with central dark discoloration.  There is no ascending erythema or fluctuance.  Intact DP and PT pulses.  Skin:    General: Skin is warm.  Neurological:     General: No focal deficit present.     Mental Status: He is alert and oriented to person, place, and time. Mental status is at baseline.     Cranial Nerves: Cranial nerve deficit present.     Motor: No abnormal muscle tone.     Coordination: Coordination normal.     Comments: No ataxia on finger to nose bilaterally. No pronator drift. 5/5 strength throughout. CN 2-12 intact.Equal grip strength. Sensation intact.   Psychiatric:        Behavior: Behavior normal.        ED Treatments / Results  Labs (all labs ordered are listed, but only abnormal results are displayed) Labs Reviewed  BASIC METABOLIC PANEL - Abnormal; Notable for the following components:      Result Value   Sodium 130 (*)    CO2 19 (*)    Glucose, Bld 360 (*)    Calcium 8.8 (*)    All other components within normal limits  URINALYSIS, ROUTINE W REFLEX MICROSCOPIC - Abnormal; Notable for the following components:   Color, Urine STRAW (*)    Glucose, UA >=500 (*)    All other components within normal limits  CBG MONITORING, ED - Abnormal; Notable for the following components:   Glucose-Capillary 319 (*)    All other components within normal limits  CBC    EKG None  Radiology Dg Foot Complete Right  Result Date: 04/14/2019 CLINICAL DATA:  Diabetic wound to right big toe. EXAM: RIGHT FOOT COMPLETE - 3+ VIEW COMPARISON:  None. FINDINGS: There is no evidence of fracture or dislocation. There is no evidence of arthropathy or other focal bone  abnormality. Soft tissues are unremarkable. IMPRESSION: Negative. Electronically Signed   By: Rolm Baptise M.D.   On: 04/14/2019 00:08    Procedures Procedures (including critical care time)  Medications Ordered in ED Medications - No data to display   Initial Impression / Assessment and Plan / ED Course  I have reviewed the triage vital signs and the nursing notes.  Pertinent labs & imaging results that were available during my care of the patient were reviewed by me and considered in my medical decision making (see chart for details).       Diabetic with right  great toe wound as above.  No fever.  Neurovascularly intact.  X-rays negative for foreign body or signs of osteomyelitis. Labs show hyperglycemia without DKA.  Blood sugar 360, anion gap 13 with bicarb of 19.  Patient will be treated with antibiotics for possible early wound infection.  Referred to the [facility] for follow-up this week.  Instructed to watch his blood sugars closely and take the antibiotics as prescribed.  Return to the ED with worsening pain, fever, spreading redness, drainage, bleeding or any concerns.  Final Clinical Impressions(s) / ED Diagnoses   Final diagnoses:  Diabetic foot infection [facility])    ED Discharge Orders    None       Ezequiel Essex, MD 04/14/19 [phone]

## 2019-05-11 ENCOUNTER — Other Ambulatory Visit: Payer: Self-pay | Admitting: Family

## 2019-05-11 DIAGNOSIS — R7989 Other specified abnormal findings of blood chemistry: Secondary | ICD-10-CM

## 2019-05-18 ENCOUNTER — Other Ambulatory Visit: Payer: Medicare PPO

## 2019-06-01 ENCOUNTER — Ambulatory Visit
Admission: RE | Admit: 2019-06-01 | Discharge: 2019-06-01 | Disposition: A | Discharge status: Home / Self Care | Payer: Medicaid Other | Source: Ambulatory Visit | Attending: Family | Admitting: Family

## 2019-06-01 DIAGNOSIS — R7989 Other specified abnormal findings of blood chemistry: Secondary | ICD-10-CM

## 2019-06-07 ENCOUNTER — Ambulatory Visit: Payer: Medicare Other | Admitting: Podiatry

## 2019-06-14 ENCOUNTER — Ambulatory Visit (INDEPENDENT_AMBULATORY_CARE_PROVIDER_SITE_OTHER): Payer: Medicare Other | Admitting: Podiatry

## 2019-06-14 DIAGNOSIS — Z5329 Procedure and treatment not carried out because of patient's decision for other reasons: Secondary | ICD-10-CM

## 2019-06-14 NOTE — Progress Notes (Signed)
   Complete physical exam  Patient: Jimmy Chandler   DOB: 07/03/1998   48 y.o. Male  MRN: [id number]  Subjective:    No chief complaint on file.   Jimmy Chandler is a 48 y.o. male who presents today for a complete physical exam. She reports consuming a {diet types:17450} diet. {types:19826} She generally feels {DESC; WELL/FAIRLY WELL/POORLY:18703}. She reports sleeping {DESC; WELL/FAIRLY WELL/POORLY:18703}. She {does/does not:200015} have additional problems to discuss today.    Most recent fall risk assessment:    03/10/2022   10:42 AM  Fall Risk   Falls in the past year? 0  Number falls in past yr: 0  Injury with Fall? 0  Risk for fall due to : No Fall Risks  Follow up Falls evaluation completed     Most recent depression screenings:    03/10/2022   10:42 AM 01/29/2021   10:46 AM  PHQ 2/9 Scores  PHQ - 2 Score 0 0  PHQ- 9 Score 5     {VISON DENTAL STD PSA (Optional):27386}  {History (Optional):23778}  Patient Care Team: Jessup, Joy, NP as PCP - General (Nurse Practitioner)   Outpatient Medications Prior to Visit  Medication Sig   fluticasone (FLONASE) 50 MCG/ACT nasal spray Place 2 sprays into both nostrils in the morning and at bedtime. After 7 days, reduce to once daily.   norgestimate-ethinyl estradiol (SPRINTEC 28) 0.25-35 MG-MCG tablet Take 1 tablet by mouth daily.   Nystatin POWD Apply liberally to affected area 2 times per day   spironolactone (ALDACTONE) 100 MG tablet Take 1 tablet (100 mg total) by mouth daily.   No facility-administered medications prior to visit.    ROS        Objective:     There were no vitals taken for this visit. {Vitals History (Optional):23777}  Physical Exam   No results found for any visits on 04/15/22. {Show previous labs (optional):23779}    Assessment & Plan:    Routine Health Maintenance and Physical Exam  Immunization History  Administered Date(s) Administered   DTaP 09/16/1999, 11/12/1999,  01/21/2000, 10/06/2000, 04/21/2004   Hepatitis A 02/16/2008, 02/21/2009   Hepatitis B 07/04/1999, 08/11/1999, 01/21/2000   HiB (PRP-OMP) 09/16/1999, 11/12/1999, 01/21/2000, 10/06/2000   IPV 09/16/1999, 11/12/1999, 07/11/2000, 04/21/2004   Influenza,inj,Quad PF,6+ Mos 05/24/2014   Influenza-Unspecified 08/23/2012   MMR 07/11/2001, 04/21/2004   Meningococcal Polysaccharide 02/21/2012   Pneumococcal Conjugate-13 10/06/2000   Pneumococcal-Unspecified 01/21/2000, 04/05/2000   Tdap 02/21/2012   Varicella 07/11/2000, 02/16/2008    Health Maintenance  Topic Date Due   HIV Screening  Never done   Hepatitis C Screening  Never done   INFLUENZA VACCINE  04/13/2022   PAP-Cervical Cytology Screening  04/15/2022 (Originally 07/02/2020)   PAP SMEAR-Modifier  04/15/2022 (Originally 07/02/2020)   TETANUS/TDAP  04/15/2022 (Originally 02/20/2022)   HPV VACCINES  Discontinued   COVID-19 Vaccine  Discontinued    Discussed health benefits of physical activity, and encouraged her to engage in regular exercise appropriate for her age and condition.  Problem List Items Addressed This Visit   None Visit Diagnoses     Annual physical exam    -  Primary   Cervical cancer screening       Need for Tdap vaccination          No follow-ups on file.     Joy Jessup, NP   

## 2019-06-25 ENCOUNTER — Ambulatory Visit: Payer: Medicare Other | Admitting: Podiatry

## 2019-07-11 DIAGNOSIS — H5213 Myopia, bilateral: Secondary | ICD-10-CM | POA: Diagnosis not present

## 2019-07-22 ENCOUNTER — Emergency Department (HOSPITAL_COMMUNITY): Payer: Medicare Other

## 2019-07-22 ENCOUNTER — Emergency Department (HOSPITAL_COMMUNITY)
Admission: EM | Admit: 2019-07-22 | Discharge: 2019-07-22 | Disposition: A | Discharge status: Home / Self Care | Payer: Medicare Other | Attending: Emergency Medicine | Admitting: Emergency Medicine

## 2019-07-22 ENCOUNTER — Encounter (HOSPITAL_COMMUNITY): Payer: Self-pay

## 2019-07-22 ENCOUNTER — Other Ambulatory Visit: Payer: Self-pay

## 2019-07-22 DIAGNOSIS — G8929 Other chronic pain: Secondary | ICD-10-CM | POA: Diagnosis not present

## 2019-07-22 DIAGNOSIS — E119 Type 2 diabetes mellitus without complications: Secondary | ICD-10-CM | POA: Diagnosis not present

## 2019-07-22 DIAGNOSIS — K760 Fatty (change of) liver, not elsewhere classified: Secondary | ICD-10-CM | POA: Insufficient documentation

## 2019-07-22 DIAGNOSIS — Z794 Long term (current) use of insulin: Secondary | ICD-10-CM | POA: Diagnosis not present

## 2019-07-22 DIAGNOSIS — R945 Abnormal results of liver function studies: Secondary | ICD-10-CM | POA: Insufficient documentation

## 2019-07-22 DIAGNOSIS — R1084 Generalized abdominal pain: Secondary | ICD-10-CM | POA: Insufficient documentation

## 2019-07-22 DIAGNOSIS — R109 Unspecified abdominal pain: Secondary | ICD-10-CM

## 2019-07-22 DIAGNOSIS — Z79899 Other long term (current) drug therapy: Secondary | ICD-10-CM | POA: Insufficient documentation

## 2019-07-22 LAB — POCT I-STAT EG7
Acid-Base Excess: 4 mmol/L — ABNORMAL HIGH (ref 0.0–2.0)
Bicarbonate: 28.6 mmol/L — ABNORMAL HIGH (ref 20.0–28.0)
Calcium, Ion: 1.02 mmol/L — ABNORMAL LOW (ref 1.15–1.40)
HCT: 41 % (ref 39.0–52.0)
Hemoglobin: 13.9 g/dL (ref 13.0–17.0)
O2 Saturation: 80 %
Potassium: 4.6 mmol/L (ref 3.5–5.1)
Sodium: 132 mmol/L — ABNORMAL LOW (ref 135–145)
TCO2: 30 mmol/L (ref 22–32)
pCO2, Ven: 43.1 mmHg — ABNORMAL LOW (ref 44.0–60.0)
pH, Ven: 7.43 (ref 7.250–7.430)
pO2, Ven: 44 mmHg (ref 32.0–45.0)

## 2019-07-22 LAB — URINALYSIS, ROUTINE W REFLEX MICROSCOPIC
Glucose, UA: 150 mg/dL — AB
Hgb urine dipstick: NEGATIVE
Ketones, ur: 5 mg/dL — AB
Leukocytes,Ua: NEGATIVE
Nitrite: NEGATIVE
Protein, ur: NEGATIVE mg/dL
Specific Gravity, Urine: 1.029 (ref 1.005–1.030)
pH: 5 (ref 5.0–8.0)

## 2019-07-22 LAB — CBC
HCT: 38.2 % — ABNORMAL LOW (ref 39.0–52.0)
Hemoglobin: 13.8 g/dL (ref 13.0–17.0)
MCH: 31.9 pg (ref 26.0–34.0)
MCHC: 36.1 g/dL — ABNORMAL HIGH (ref 30.0–36.0)
MCV: 88.2 fL (ref 80.0–100.0)
Platelets: 209 10*3/uL (ref 150–400)
RBC: 4.33 MIL/uL (ref 4.22–5.81)
RDW: 14.6 % (ref 11.5–15.5)
WBC: 5.5 10*3/uL (ref 4.0–10.5)
nRBC: 0 % (ref 0.0–0.2)

## 2019-07-22 LAB — COMPREHENSIVE METABOLIC PANEL
ALT: 375 U/L — ABNORMAL HIGH (ref 0–44)
AST: 526 U/L — ABNORMAL HIGH (ref 15–41)
Albumin: 2.8 g/dL — ABNORMAL LOW (ref 3.5–5.0)
Alkaline Phosphatase: 359 U/L — ABNORMAL HIGH (ref 38–126)
Anion gap: 16 — ABNORMAL HIGH (ref 5–15)
BUN: 11 mg/dL (ref 6–20)
CO2: 18 mmol/L — ABNORMAL LOW (ref 22–32)
Calcium: 8.3 mg/dL — ABNORMAL LOW (ref 8.9–10.3)
Chloride: 96 mmol/L — ABNORMAL LOW (ref 98–111)
Creatinine, Ser: 1.26 mg/dL — ABNORMAL HIGH (ref 0.61–1.24)
GFR calc Af Amer: >60 mL/min (ref >60)
GFR calc non Af Amer: >60 mL/min (ref >60)
Glucose, Bld: 291 mg/dL — ABNORMAL HIGH (ref 70–99)
Potassium: 5.3 mmol/L — ABNORMAL HIGH (ref 3.5–5.1)
Sodium: 130 mmol/L — ABNORMAL LOW (ref 135–145)
Total Bilirubin: 4.2 mg/dL — ABNORMAL HIGH (ref 0.3–1.2)
Total Protein: 6.3 g/dL — ABNORMAL LOW (ref 6.5–8.1)

## 2019-07-22 LAB — LIPASE, BLOOD: Lipase: 26 U/L (ref 11–51)

## 2019-07-22 LAB — CBG MONITORING, ED: Glucose-Capillary: 305 mg/dL — ABNORMAL HIGH (ref 70–99)

## 2019-07-22 MED ORDER — SODIUM CHLORIDE 0.9% FLUSH: 3.0000 mL | Freq: Once | INTRAVENOUS | Status: DC

## 2019-07-22 NOTE — ED Notes (Signed)
Pt given urinal to provide urine sample

## 2019-07-22 NOTE — Discharge Instructions (Signed)
Please follow up with [facility] in regards to your chronic abdominal pain and elevated liver function tests.  Refrain from drinking and increase water intake

## 2019-07-22 NOTE — ED Notes (Signed)
Patient verbalizes understanding of discharge instructions. Opportunity for questioning and answers were provided. Armband removed by staff, pt discharged from ED ambulatory to home.  

## 2019-07-22 NOTE — ED Provider Notes (Signed)
[facility] EMERGENCY DEPARTMENT Provider Note   CSN: [id number] Arrival date & time: 07/22/19  1020     History   Chief Complaint No chief complaint on file.   HPI Jimmy Chandler is a 48 y.o. male with PMHx diabetes and parotid cancer who presents to the ED today for abnormal lab.  Reports he saw his PCP last week for regular visit.  He had blood work done and was told to come to the ED for further evaluation as he had an abnormal lab.  Patient has lab paperwork with him, triglycerides elevated at 1413.  Has elevation in AST and ALT at 176 and 157.  he reports his primary care doctor told him to come over to be evaluated for liver issues as well as pancreatitis.  Patient reports that he has had chronic abdominal pain since a Parotidectomy with skin graft from abdomen done in 2015. No change in pain today. Pt denies fever, chills, nausea, vomiting, diarrhea, constipation, urinary sx, or any other associated symptoms. Alcohol use one 12 ounce beer every 2-3 days per patient.      The history is provided by the patient and medical records.    Past Medical History:  Diagnosis Date  . Cancer ([place])   . Diabetes mellitus without complication ([place])     There are no active problems to display for this patient.   History reviewed. No pertinent surgical history.      Home Medications    Prior to Admission medications   Medication Sig Start Date End Date Taking? Authorizing Provider  atorvastatin (LIPITOR) 80 MG tablet Take 1 tablet by mouth daily. 05/08/19  Yes [provider]  escitalopram (LEXAPRO) 20 MG tablet Take 20 mg by mouth daily.   Yes [provider]  fluticasone (FLONASE) 50 MCG/ACT nasal spray Place 2 sprays into both nostrils daily. 10/21/18  Yes Fawze, Mina A, PA-C  gabapentin (NEURONTIN) 300 MG capsule Take 300 mg by mouth 3 (three) times daily.   Yes [provider]  glipiZIDE (GLUCOTROL) 10 MG tablet Take 10 mg by mouth daily  before breakfast.   Yes [provider]  pantoprazole (PROTONIX) 40 MG tablet Take 1 tablet by mouth daily. 05/02/19  Yes [provider]  sitaGLIPtin (JANUVIA) 100 MG tablet Take 100 mg by mouth daily.   Yes [provider]  traMADol (ULTRAM) 50 MG tablet Take 1 tablet by mouth daily as needed for pain. 06/14/19  Yes [provider]  amoxicillin-clavulanate (AUGMENTIN) 875-125 MG tablet Take 1 tablet by mouth every 12 (twelve) hours. Patient not taking: Reported on 07/22/2019 04/14/19   Ezequiel Essex, MD  benzonatate (TESSALON) 100 MG capsule Take 1 capsule (100 mg total) by mouth 3 (three) times daily as needed for cough. Patient not taking: Reported on 07/22/2019 10/21/18   Rodell Perna A, PA-C  doxycycline (VIBRAMYCIN) 100 MG capsule Take 1 capsule (100 mg total) by mouth 2 (two) times daily. Patient not taking: Reported on 07/22/2019 04/14/19   Ezequiel Essex, MD  metFORMIN (GLUCOPHAGE) 500 MG tablet Take 1 tablet (500 mg total) by mouth 2 (two) times daily with a meal for 30 days. Patient not taking: Reported on 07/22/2019 03/10/19 07/22/19  Lorin Glass, PA-C  oseltamivir (TAMIFLU) 75 MG capsule Take 1 capsule (75 mg total) by mouth every 12 (twelve) hours. Patient not taking: Reported on 03/09/2019 10/21/18   Renita Papa, PA-C    Family History No family history on file.  Social  History Social History   Tobacco Use  . Smoking status: Never Smoker  . Smokeless tobacco: Never Used  Substance Use Topics  . Alcohol use: Yes  . Drug use: Never     Allergies   Patient has no known allergies.   Review of Systems Review of Systems  Constitutional: Negative for chills and fever.  Gastrointestinal: Positive for abdominal pain (chronic). Negative for nausea and vomiting.  Genitourinary: Negative for difficulty urinating, flank pain and frequency.  All other systems reviewed and are negative.    Physical Exam Updated Vital Signs BP (!) 144/95  (BP Location: Right Arm)   Temp 98.1 F (36.7 C) (Oral)   Resp 12   SpO2 100%   Physical Exam Vitals signs and nursing note reviewed.  Constitutional:      Appearance: He is not ill-appearing or diaphoretic.  HENT:     Head: Normocephalic and atraumatic.  Eyes:     Conjunctiva/sclera: Conjunctivae normal.  Neck:     Musculoskeletal: Neck supple.  Cardiovascular:     Rate and Rhythm: Normal rate and regular rhythm.     Pulses: Normal pulses.  Pulmonary:     Effort: Pulmonary effort is normal.     Breath sounds: Normal breath sounds. No wheezing, rhonchi or rales.  Abdominal:     Palpations: Abdomen is soft.     Tenderness: There is abdominal tenderness. There is no right CVA tenderness, left CVA tenderness, guarding or rebound.     Comments: Transverse surgical scar noted below periumbilical region, diffusely tender throughout abdomen that pt reports is his baseline since surgery in 2015, TTP > RUQ compared to rest of abdomen, +BS throughout, no r/g/r, neg murphy's, neg mcburney's, no CVA TTP  Skin:    General: Skin is warm and dry.  Neurological:     Mental Status: He is alert.      ED Treatments / Results  Labs (all labs ordered are listed, but only abnormal results are displayed) Labs Reviewed  COMPREHENSIVE METABOLIC PANEL - Abnormal; Notable for the following components:      Result Value   Sodium 130 (*)    Potassium 5.3 (*)    Chloride 96 (*)    CO2 18 (*)    Glucose, Bld 291 (*)    Creatinine, Ser 1.26 (*)    Calcium 8.3 (*)    Total Protein 6.3 (*)    Albumin 2.8 (*)    AST 526 (*)    ALT 375 (*)    Alkaline Phosphatase 359 (*)    Total Bilirubin 4.2 (*)    Anion gap 16 (*)    All other components within normal limits  CBC - Abnormal; Notable for the following components:   HCT 38.2 (*)    MCHC 36.1 (*)    All other components within normal limits  URINALYSIS, ROUTINE W REFLEX MICROSCOPIC - Abnormal; Notable for the following components:   Color,  Urine AMBER (*)    Glucose, UA 150 (*)    Bilirubin Urine SMALL (*)    Ketones, ur 5 (*)    All other components within normal limits  CBG MONITORING, ED - Abnormal; Notable for the following components:   Glucose-Capillary 305 (*)    All other components within normal limits  POCT I-STAT EG7 - Abnormal; Notable for the following components:   pCO2, Ven 43.1 (*)    Bicarbonate 28.6 (*)    Acid-Base Excess 4.0 (*)    Sodium 132 (*)  Calcium, Ion 1.02 (*)    All other components within normal limits  LIPASE, BLOOD  I-STAT VENOUS BLOOD GAS, ED    EKG None  Radiology US Abdomen Limited Ruq  Result Date: 07/22/2019 CLINICAL DATA:  Right upper quadrant pain for 2 days EXAM: ULTRASOUND ABDOMEN LIMITED RIGHT UPPER QUADRANT COMPARISON:  06/01/2019 FINDINGS: Gallbladder: No cholelithiasis or pericholecystic fluid. Gallbladder wall thickening more focally thickened measuring up to 4 mm. Positive sonographic Murphy sign. Common bile duct: Diameter: 6 mm Liver: No focal lesion identified. Increased hepatic parenchymal echogenicity. Portal vein is patent on color Doppler imaging with normal direction of blood flow towards the liver. Other: None. IMPRESSION: 1. No cholelithiasis. Mild relative thickening of the gallbladder wall measuring up to 4 mm with a reported positive sonographic Murphy sign. This may reflect an under distended gallbladder versus acalculus cholecystitis versus reactive changes as can be seen in hepatocellular disease. 2. Increased hepatic echogenicity as can be seen with hepatic steatosis. Electronically Signed   By: Kathreen Devoid   On: 07/22/2019 13:35    Procedures Procedures (including critical care time)  Medications Ordered in ED Medications  sodium chloride flush (NS) 0.9 % injection 3 mL (3 mLs Intravenous Not Given 07/22/19 1109)     Initial Impression / Assessment and Plan / ED Course  I have reviewed the triage vital signs and the nursing notes.  Pertinent  labs & imaging results that were available during my care of the patient were reviewed by me and considered in my medical decision making (see chart for details).    Male presents the ED after obtaining normal labs at PCPs office earlier this week with elevated triglycerides greater than 1000, elevated AST and ALT although normal alk phos and T bili.  PCP sent him here to rule out pancreatitis given risk with increase in triglycerides.he reports he drinks about 1 12 ounce beer every 2 to 3 days.  It is unfortunately very difficult to obtain information from patient.  He states that he has had chronic abdominal pain for the past 5 years after skin graft for parotidectomy.  It is diffusely tender on exam although he states that this is his baseline.  He is more tender in the right upper quadrant compared to the rest of his abdomen.  Peritoneal signs.  Patient states if he just lays here he has no abdominal pain although if you push on his abdomen he is going to have pain.  Vital signs are stable today.  He is afebrile without tachycardia or tachypnea.  He needs to request something to eat in the ED.  Have advised that he needs to hold off at this time.  Screening labs obtained.  CBC - no leukocytosis. Hgb stable.  CMP - Elevated glucose with decrease bicarb 18 and gap of 16. Doubt DKA although will obtain VBG at this time. Potassium 5.3 (was 5.5 at PCP's office) although sample does appear to be hemolyzed. Creatinine 1.26, pt's baseline appears to be 1.10. LFTs significantly elevated compared to even just a few days ago; will obtain RUQ ultrasound at this time. It does appear pt had one done in September which showed some liver infiltration.   Lab Results  Component Value Date   CREATININE 1.26 (H) 07/22/2019   CREATININE 1.12 04/13/2019   CREATININE 0.98 03/09/2019   Ultrasound with wall thickening of the gallbladder although no cholelithiasis.  may reflect an under distended gallbladder versus   acalculus cholecystitis versus reactive changes as can be  seen in  hepatocellular disease.   Again pt diffusely tender although more so in the RUQ. Suspect findings more consistent with liver disease. Have discussed case with attending physician Dr. Alvino Chapel who recommends GI outpatient follow up. VBG with normal Ph and bicarb within normal limits. Pt able to eat and drink in the ED without difficulty; feel he is stable for discharge home. Have recommended he refrain from drinking altogether and to follow up with GI. Strict return precautions have been discussed. Pt is in agreement with plan at this time and stable for discharge home.   This note was prepared using Dragon voice recognition software and may include unintentional dictation errors due to the inherent limitations of voice recognition software.         Final Clinical Impressions(s) / ED Diagnoses   Final diagnoses:  Liver function abnormality  Chronic abdominal pain  Fatty liver  Type 2 diabetes mellitus without complication, with long-term current use of insulin [facility])    ED Discharge Orders    None       Eustaquio Maize, PA-C 07/22/19 1602    Davonna Belling, MD 07/22/19 2030

## 2019-07-22 NOTE — ED Notes (Signed)
Patient transported to Ultrasound 

## 2019-07-22 NOTE — ED Triage Notes (Signed)
Patient sent from urgent care for further evaluation of elevated blood sugars and possible pancreatitis or liver issue. Labs reports abnormal as high triglycerides. Reports abdominal pain and not feeling well

## 2019-07-22 NOTE — ED Notes (Signed)
Pt asked for blood sugar check. Pt's CBG result was 305. Informed Mallory - RN.

## 2019-07-22 NOTE — ED Notes (Signed)
Pt given [place] sandwich and sprite zero to eat per PA

## 2019-07-23 ENCOUNTER — Encounter: Payer: Self-pay | Admitting: Internal Medicine

## 2019-07-27 ENCOUNTER — Other Ambulatory Visit: Payer: Self-pay | Admitting: Family

## 2019-07-27 DIAGNOSIS — R413 Other amnesia: Secondary | ICD-10-CM

## 2019-07-31 ENCOUNTER — Other Ambulatory Visit: Payer: Medicare Other

## 2019-08-06 ENCOUNTER — Encounter: Payer: Self-pay | Admitting: Neurology

## 2019-08-08 ENCOUNTER — Encounter: Payer: Self-pay | Admitting: *Deleted

## 2019-08-15 DIAGNOSIS — H524 Presbyopia: Secondary | ICD-10-CM | POA: Diagnosis not present

## 2019-08-16 ENCOUNTER — Ambulatory Visit: Payer: 59 | Admitting: Internal Medicine

## 2019-09-27 ENCOUNTER — Other Ambulatory Visit: Payer: Self-pay

## 2019-09-27 ENCOUNTER — Encounter (HOSPITAL_COMMUNITY): Payer: Self-pay | Admitting: Emergency Medicine

## 2019-09-27 ENCOUNTER — Ambulatory Visit (HOSPITAL_COMMUNITY)
Admission: EM | Admit: 2019-09-27 | Discharge: 2019-09-27 | Disposition: A | Discharge status: Home / Self Care | Payer: Medicaid Other | Attending: Family Medicine | Admitting: Family Medicine

## 2019-09-27 DIAGNOSIS — R601 Generalized edema: Secondary | ICD-10-CM | POA: Diagnosis present

## 2019-09-27 MED ORDER — FUROSEMIDE 20 MG PO TABS: 20.0000 mg | ORAL_TABLET | Freq: Every day | ORAL | 0 refills | Status: DC

## 2019-09-27 NOTE — ED Triage Notes (Signed)
Reports bilateral pedal edema for 2 days.  Reports swelling did go away, but returned today.  Patient has pitting edema to both legs, mid thigh, bilaterally.  Patient denies any issues like this prior to the past few days.  Denies sob, denies chest pain.

## 2019-09-27 NOTE — Discharge Instructions (Signed)
Reduce salt intake Keep legs elevated while sitting Keep appointment with specialist later this month for liver disease

## 2019-09-27 NOTE — ED Provider Notes (Signed)
[place]    CSN: [id number] Arrival date & time: 09/27/19  1809      History   Chief Complaint Chief Complaint  Patient presents with  . Edema    HPI Jimmy Chandler is a 49 y.o. male.   2-day history of dependent edema.  It improves after he has elevate his legs at night.  He has a several chronic problems including liver disease diabetes.  He denies any history of heart or renal disease.  He does admit to eating some salt recently on apples.  He denies any PND orthopnea or chest pain. HPI  Past Medical History:  Diagnosis Date  . Diabetes mellitus without complication ([place])   . Elevated LFTs   . History of parotid cancer     There are no problems to display for this patient.   Past Surgical History:  Procedure Laterality Date  . INCISION AND DRAINAGE DEEP NECK ABSCESS    . PAROTIDECTOMY         Home Medications    Prior to Admission medications   Medication Sig Start Date End Date Taking? Authorizing Provider  atorvastatin (LIPITOR) 80 MG tablet Take 1 tablet by mouth daily. 05/08/19  Yes [provider]  escitalopram (LEXAPRO) 20 MG tablet Take 20 mg by mouth daily.   Yes [provider]  gabapentin (NEURONTIN) 300 MG capsule Take 300 mg by mouth 3 (three) times daily.   Yes [provider]  glipiZIDE (GLUCOTROL) 10 MG tablet Take 10 mg by mouth daily before breakfast.   Yes [provider]  pantoprazole (PROTONIX) 40 MG tablet Take 1 tablet by mouth daily. 05/02/19  Yes [provider]  sitaGLIPtin (JANUVIA) 100 MG tablet Take 100 mg by mouth daily.   Yes [provider]  amoxicillin-clavulanate (AUGMENTIN) 875-125 MG tablet Take 1 tablet by mouth every 12 (twelve) hours. Patient not taking: Reported on 07/22/2019 04/14/19   Ezequiel Essex, MD  benzonatate (TESSALON) 100 MG capsule Take 1 capsule (100 mg total) by mouth 3 (three) times daily as needed for cough. Patient not taking: Reported on  07/22/2019 10/21/18   Rodell Perna A, PA-C  doxycycline (VIBRAMYCIN) 100 MG capsule Take 1 capsule (100 mg total) by mouth 2 (two) times daily. Patient not taking: Reported on 07/22/2019 04/14/19   Ezequiel Essex, MD  fluticasone [facility]) 50 MCG/ACT nasal spray Place 2 sprays into both nostrils daily. 10/21/18   Fawze, Mina A, PA-C  metFORMIN (GLUCOPHAGE) 500 MG tablet Take 1 tablet (500 mg total) by mouth 2 (two) times daily with a meal for 30 days. 03/10/19 07/22/19  Lorin Glass, PA-C  oseltamivir (TAMIFLU) 75 MG capsule Take 1 capsule (75 mg total) by mouth every 12 (twelve) hours. Patient not taking: Reported on 03/09/2019 10/21/18   Rodell Perna A, PA-C  traMADol (ULTRAM) 50 MG tablet Take 1 tablet by mouth daily as needed for pain. 06/14/19   [provider]    Family History Family History  Problem Relation Age of Onset  . Cancer Mother   . Hypertension Father     Social History Social History   Tobacco Use  . Smoking status: Never Smoker  . Smokeless tobacco: Never Used  Substance Use Topics  . Alcohol use: Yes    Alcohol/week: 14.0 standard drinks    Types: 14 Cans of beer per week  . Drug use: Never     Allergies   Patient has no known allergies.   Review of Systems Review  of Systems  Constitutional: Negative.   Respiratory: Negative for apnea, choking, chest tightness and shortness of breath.   Cardiovascular: Positive for leg swelling. Negative for chest pain.  All other systems reviewed and are negative.    Physical Exam Triage Vital Signs ED Triage Vitals  Enc Vitals Group     BP 09/27/19 1834 118/85     Pulse Rate 09/27/19 1834 94     Resp --      Temp 09/27/19 1834 98.5 F (36.9 C)     Temp Source 09/27/19 1834 Oral     SpO2 09/27/19 1834 100 %     Weight --      Height --      Head Circumference --      Peak Flow --      Pain Score 09/27/19 1828 9     Pain Loc --      Pain Edu? --      Excl. in [place]? --    No data found.  Updated  Vital Signs BP 118/85 (BP Location: Right Arm)   Pulse 94   Temp 98.5 F (36.9 C) (Oral)   SpO2 100%   Visual Acuity Right Eye Distance:   Left Eye Distance:   Bilateral Distance:    Right Eye Near:   Left Eye Near:    Bilateral Near:     Physical Exam Constitutional:      Appearance: Normal appearance.  HENT:     Head: Normocephalic.  Cardiovascular:     Rate and Rhythm: Normal rate.     Pulses: Normal pulses.     Heart sounds: Normal heart sounds.  Pulmonary:     Effort: Pulmonary effort is normal.     Breath sounds: Normal breath sounds.  Musculoskeletal:     Right lower leg: Edema present.     Left lower leg: Edema present.  Neurological:     Mental Status: He is alert.      UC Treatments / Results  Labs (all labs ordered are listed, but only abnormal results are displayed) Labs Reviewed - No data to display  EKG   Radiology No results found.  Procedures Procedures (including critical care time)  Medications Ordered in UC Medications - No data to display  Initial Impression / Assessment and Plan / UC Course  I have reviewed the triage vital signs and the nursing notes.  Pertinent labs & imaging results that were available during my care of the patient were reviewed by me and considered in my medical decision making (see chart for details).     Edema, probably secondary to liver disease.  There is no history or exam to suggest congestive heart failure. Final Clinical Impressions(s) / UC Diagnoses   Final diagnoses:  None   Discharge Instructions   None    ED Prescriptions    None     PDMP not reviewed this encounter.   Wardell Honour, MD 09/27/19 [place]

## 2019-09-27 NOTE — ED Notes (Signed)
Dr Lanny Cramp spoke to patient at triage

## 2019-10-03 DIAGNOSIS — R7401 Elevation of levels of liver transaminase levels: Secondary | ICD-10-CM | POA: Diagnosis not present

## 2019-10-03 DIAGNOSIS — R109 Unspecified abdominal pain: Secondary | ICD-10-CM | POA: Diagnosis not present

## 2019-10-03 DIAGNOSIS — E781 Pure hyperglyceridemia: Secondary | ICD-10-CM | POA: Diagnosis not present

## 2019-10-03 DIAGNOSIS — R112 Nausea with vomiting, unspecified: Secondary | ICD-10-CM | POA: Diagnosis not present

## 2019-10-03 DIAGNOSIS — K9049 Malabsorption due to intolerance, not elsewhere classified: Secondary | ICD-10-CM | POA: Diagnosis not present

## 2019-10-03 DIAGNOSIS — G8929 Other chronic pain: Secondary | ICD-10-CM | POA: Diagnosis not present

## 2019-10-03 DIAGNOSIS — Z8719 Personal history of other diseases of the digestive system: Secondary | ICD-10-CM | POA: Diagnosis not present

## 2019-10-03 DIAGNOSIS — R609 Edema, unspecified: Secondary | ICD-10-CM | POA: Diagnosis not present

## 2019-10-09 ENCOUNTER — Ambulatory Visit: Payer: Medicare Other | Admitting: Neurology

## 2019-10-09 ENCOUNTER — Ambulatory Visit: Payer: Medicaid Other | Admitting: Neurology

## 2019-10-31 DIAGNOSIS — L988 Other specified disorders of the skin and subcutaneous tissue: Secondary | ICD-10-CM | POA: Insufficient documentation

## 2019-10-31 DIAGNOSIS — Z923 Personal history of irradiation: Secondary | ICD-10-CM | POA: Insufficient documentation

## 2019-11-06 ENCOUNTER — Ambulatory Visit (INDEPENDENT_AMBULATORY_CARE_PROVIDER_SITE_OTHER): Payer: Medicaid Other | Admitting: Neurology

## 2019-11-06 ENCOUNTER — Other Ambulatory Visit: Payer: Self-pay

## 2019-11-06 ENCOUNTER — Encounter: Payer: Self-pay | Admitting: Neurology

## 2019-11-06 ENCOUNTER — Other Ambulatory Visit: Payer: Self-pay | Admitting: Neurology

## 2019-11-06 ENCOUNTER — Other Ambulatory Visit: Payer: Medicaid Other

## 2019-11-06 VITALS — BP 162/95 | HR 86 | Ht 68.0 in | Wt 197.4 lb

## 2019-11-06 DIAGNOSIS — R413 Other amnesia: Secondary | ICD-10-CM

## 2019-11-06 DIAGNOSIS — R4182 Altered mental status, unspecified: Secondary | ICD-10-CM

## 2019-11-06 NOTE — Progress Notes (Signed)
NEUROLOGY CONSULTATION NOTE  Jimmy Chandler MRN: [id number] DOB: 1970-12-31  Referring provider: Dustin Folks, FNP Primary care provider:  Dustin Folks, FNP  Reason for consult:  Gait and memory with progressively worsening symptoms over the last 2 months   Thank you for your kind referral of Jimmy Chandler for consultation of the above symptoms. Although his history is well known to you, please allow me to reiterate it for the purpose of our medical record. He is alone in the office today. Records and images were personally reviewed where available.   HISTORY OF PRESENT ILLNESS: This is a 49 year old right-handed man with a history of diabetes, parotid cancer s/p parotidectomy, presenting for evaluation of cognitive and gait changes. He is alone in the office. He lives alone, his uncle down the street checks on him. He reports memory changes started around 8 months ago, he would leave the stove on and burn food, or leave the water running. He would have to come back and check if he had closed the door and that everything was turned off. He feels symptoms have progressively worsened, he cannot keep up with his medications and has missed bill payments. He feels he has so much medication and tries to keep it in order, missing 2-3 days in a week. He drives and has gotten lost, no accidents. Balance problems also worsened 8 months ago, he fumbled last night and caught himself. He has pain in his back, both legs and feet. He does not have the right feeling in both legs with tingling and swelling. He also has tingling in both hands. He has urinary incontinence, no bowel dysfunction. He denies any headaches, dysarthria/dysphagia. He has noticed reduced sense of smell. He has occasional tremors in both hands. He has sleep difficulties for the past 1.5 years, waking up and using the bathroom, which can sometimes lead to falls. He usually gets 3-4 hours of sleep. He gets lightheaded when he cannot sleep. His right  eye has been blurred since parotid surgery at age 26, stating they cut nerves on both sides. He reports a lot of anxiety, drinking 1 and 1/2 beers daily to calm his nerves for the past 2 months. He hears a cat or dogs barking at night when he cannot sleep but does not see them. He has had problems eating, appetite is not like before. He has occasional neck stiffness, with a sharp pain where he cannot move his head sometimes. He finished the 10th grade and was working in [employer] until his cancer diagnosis at age 5. His paternal grandmother had memory issues. No history of significant head injuries.   Laboratory Data:  Lab Results  Component Value Date   WBC 5.5 07/22/2019   HGB 13.9 07/22/2019   HCT 41.0 07/22/2019   MCV 88.2 07/22/2019   PLT 209 07/22/2019     Chemistry      Component Value Date/Time   NA 132 (L) 07/22/2019 1515   K 4.6 07/22/2019 1515   CL 96 (L) 07/22/2019 1033   CO2 18 (L) 07/22/2019 1033   BUN 11 07/22/2019 1033   CREATININE 1.26 (H) 07/22/2019 1033      Component Value Date/Time   CALCIUM 8.3 (L) 07/22/2019 1033   ALKPHOS 359 (H) 07/22/2019 1033   AST 526 (H) 07/22/2019 1033   ALT 375 (H) 07/22/2019 1033   BILITOT 4.2 (H) 07/22/2019 1033       PAST MEDICAL HISTORY: Past Medical History:  Diagnosis Date  .  Diabetes mellitus without complication ([place])   . Elevated LFTs   . History of parotid cancer     PAST SURGICAL HISTORY: Past Surgical History:  Procedure Laterality Date  . INCISION AND DRAINAGE DEEP NECK ABSCESS    . PAROTIDECTOMY      MEDICATIONS: Current Outpatient Medications on File Prior to Visit  Medication Sig Dispense Refill  . atorvastatin (LIPITOR) 80 MG tablet Take 1 tablet by mouth daily.    Marland Kitchen escitalopram (LEXAPRO) 10 MG tablet Take 10 mg by mouth daily.     . fluticasone (FLONASE) 50 MCG/ACT nasal spray Place 2 sprays into both nostrils daily. 16 g 0  . furosemide (LASIX) 20 MG tablet Take 1 tablet (20 mg total) by mouth  daily. 6 tablet 0  . gabapentin (NEURONTIN) 300 MG capsule Take 300 mg by mouth 3 (three) times daily.    Marland Kitchen glipiZIDE (GLUCOTROL) 10 MG tablet Take 10 mg by mouth daily before breakfast.    . metFORMIN (GLUCOPHAGE) 500 MG tablet Take 1 tablet (500 mg total) by mouth 2 (two) times daily with a meal for 30 days. 60 tablet 0  . pantoprazole (PROTONIX) 40 MG tablet Take 1 tablet by mouth daily.    . sitaGLIPtin (JANUVIA) 100 MG tablet Take 100 mg by mouth daily.    Marland Kitchen amoxicillin-clavulanate (AUGMENTIN) 875-125 MG tablet Take 1 tablet by mouth every 12 (twelve) hours. (Patient not taking: Reported on 07/22/2019) 20 tablet 0  . traMADol (ULTRAM) 50 MG tablet Take 1 tablet by mouth daily as needed for pain.     No current facility-administered medications on file prior to visit.    ALLERGIES: No Known Allergies  FAMILY HISTORY: Family History  Problem Relation Age of Onset  . Cancer Mother   . Hypertension Father     SOCIAL HISTORY: Social History   Socioeconomic History  . Marital status: Significant Other    Spouse name: Not on file  . Number of children: Not on file  . Years of education: Not on file  . Highest education level: Not on file  Occupational History  . Not on file  Tobacco Use  . Smoking status: Current Every Day Smoker    Types: Cigars  . Smokeless tobacco: Never Used  Substance and Sexual Activity  . Alcohol use: Yes    Alcohol/week: 14.0 standard drinks    Types: 14 Cans of beer per week  . Drug use: Never  . Sexual activity: Not on file  Other Topics Concern  . Not on file  Social History Narrative  . Not on file   Social Determinants of Health   Financial Resource Strain:   . Difficulty of Paying Living Expenses: Not on file  Food Insecurity:   . Worried About Charity fundraiser in the Last Year: Not on file  . Ran Out of Food in the Last Year: Not on file  Transportation Needs:   . Lack of Transportation (Medical): Not on file  . Lack of  Transportation (Non-Medical): Not on file  Physical Activity:   . Days of Exercise per Week: Not on file  . Minutes of Exercise per Session: Not on file  Stress:   . Feeling of Stress : Not on file  Social Connections:   . Frequency of Communication with Friends and Family: Not on file  . Frequency of Social Gatherings with Friends and Family: Not on file  . Attends Religious Services: Not on file  . Active Member of Clubs  or Organizations: Not on file  . Attends Archivist Meetings: Not on file  . Marital Status: Not on file  Intimate Partner Violence:   . Fear of Current or Ex-Partner: Not on file  . Emotionally Abused: Not on file  . Physically Abused: Not on file  . Sexually Abused: Not on file    REVIEW OF SYSTEMS: Constitutional: No fevers, chills, or sweats, + generalized fatigue, change in appetite Eyes: No visual changes, double vision, eye pain Ear, nose and throat: No hearing loss, ear pain, nasal congestion, sore throat Cardiovascular: No chest pain, palpitations Respiratory:  No shortness of breath at rest or with exertion, wheezes GastrointestinaI: No nausea, vomiting, diarrhea, abdominal pain, fecal incontinence Genitourinary:  No dysuria, urinary retention or frequency Musculoskeletal:  + neck pain, back pain Integumentary: No rash, pruritus, skin lesions Neurological: as above Psychiatric: No depression, +insomnia, anxiety Endocrine: No palpitations, fatigue, diaphoresis, mood swings, change in appetite, change in weight, increased thirst Hematologic/Lymphatic:  No anemia, purpura, petechiae. Allergic/Immunologic: no itchy/runny eyes, nasal congestion, recent allergic reactions, rashes  PHYSICAL EXAM: Vitals:   11/06/19 1305  BP: (!) 162/95  Pulse: 86  SpO2: 98%   General: No acute distress, patient is drowsy, bloodshot eyes Head:  Normocephalic/atraumatic Skin/Extremities: No rash, no edema Neurological Exam: Mental status: alert and oriented  to person, place, and time, no dysarthria or aphasia, Fund of knowledge is appropriate.  Recent and remote memory are impaired.  Attention and concentration are reduced. SLUMS score 7/30 [employer] Mental Exam 11/06/2019  Weekday Correct 1  Current year 1  What state are we in? 1  Amount spent 0  Amount left 0  # of Animals 1  5 objects recall 0  Number series 0  Hour markers 0  Time correct 0  Placed X in triangle correctly 1  Largest Figure 0  Name of male 2  Date back to work 0  Type of work 0  State she lived in 0  Total score 7    Cranial nerves: CN I: not tested CN II: pupils equal, round and reactive to light, visual fields intact CN III, IV, VI:  full range of motion, no nystagmus, no ptosis CN V: facial sensation intact CN VII: right facial paralysis (chronic since parotid surgery) with decreased eye blink on right eye CN VIII: hearing intact to conversation Bulk & Tone: normal, no fasciculations, no cogwheeling Motor: 5/5 throughout with no pronator drift. Sensation: intact to light touch, cold, pin on both UE, decreased on both LE.  No extinction to double simultaneous stimulation.  Romberg test slight sway Deep Tendon Reflexes: +2 throughout except for absent ankle jerks, no ankle clonus Plantar responses: downgoing bilaterally Cerebellar: no incoordination on finger to nose testing but does not touch nose and instead touches a different part of his face, heel to shin, reporting knee pain with heel to shin testing. He is able to do rapid alternating movements but slowly Gait: slow and cautious, no ataxia Tremor: none. No asterixis  IMPRESSION: This is a 49 year old right-handed man with a history of diabetes, parotid cancer s/p parotidectomy, presenting for evaluation of cognitive and gait changes over the past 8 months. On exam today, he is drowsy with bloodshot eyes, reporting poor sleep. He is slow to respond to questions but able to answer, SLUMS score  7/30. 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 No clear ataxia noted today. Etiology of symptoms unclear, prior bloodwork in November  showed elevated LFTs, cognitive/gait changes may be due to toxic/metabolic cause. Check ethanol level, ammonia level. Bloodwork for CBC, CMP, TSH, B12, RPR, ESR, CRP, ANA, CK will be ordered as well. MRI brain with and without contrast will be ordered to assess for underlying structural abnormality. Poor sleep may be contributing as well, discuss with PCP. Follow-up after tests, he knows to call for any changes.   Thank you for allowing me to participate in the care of this patient. Please do not hesitate to call for any questions or concerns.   Ellouise Newer, M.D.  CC: Dustin Folks, FNP

## 2019-11-06 NOTE — Patient Instructions (Addendum)
1. Bloodwork for CBC, CMP, TSH, B12, RPR, ammonia, ESR, CRP, ANA, CK, ethanol level  2. Schedule MRI brain with and without contrast  3. Discuss sleep with Dustin Folks  4. Follow-up after tests, call for any changes

## 2019-11-07 LAB — COMPLETE METABOLIC PANEL WITH GFR
AG Ratio: 0.8 (calc) — ABNORMAL LOW (ref 1.0–2.5)
ALT: 26 U/L (ref 9–46)
AST: 68 U/L — ABNORMAL HIGH (ref 10–40)
Albumin: 3.3 g/dL — ABNORMAL LOW (ref 3.6–5.1)
Alkaline phosphatase (APISO): 159 U/L — ABNORMAL HIGH (ref 36–130)
BUN/Creatinine Ratio: 6 (calc) (ref 6–22)
BUN: 5 mg/dL — ABNORMAL LOW (ref 7–25)
CO2: 23 mmol/L (ref 20–32)
Calcium: 8.5 mg/dL — ABNORMAL LOW (ref 8.6–10.3)
Chloride: 102 mmol/L (ref 98–110)
Creat: 0.81 mg/dL (ref 0.60–1.35)
GFR, Est African American: 123 mL/min/{1.73_m2} (ref >=60)
GFR, Est Non African American: 106 mL/min/{1.73_m2} (ref >=60)
Globulin: 4.1 g/dL (calc) — ABNORMAL HIGH (ref 1.9–3.7)
Glucose, Bld: 130 mg/dL — ABNORMAL HIGH (ref 65–99)
Potassium: 3.9 mmol/L (ref 3.5–5.3)
Sodium: 134 mmol/L — ABNORMAL LOW (ref 135–146)
Total Bilirubin: 0.7 mg/dL (ref 0.2–1.2)
Total Protein: 7.4 g/dL (ref 6.1–8.1)

## 2019-11-07 LAB — CBC
HCT: 38.1 % — ABNORMAL LOW (ref 38.5–50.0)
Hemoglobin: 12.9 g/dL — ABNORMAL LOW (ref 13.2–17.1)
MCH: 31.5 pg (ref 27.0–33.0)
MCHC: 33.9 g/dL (ref 32.0–36.0)
MCV: 93.2 fL (ref 80.0–100.0)
MPV: 11.7 fL (ref 7.5–12.5)
Platelets: 174 10*3/uL (ref 140–400)
RBC: 4.09 10*6/uL — ABNORMAL LOW (ref 4.20–5.80)
RDW: 12.7 % (ref 11.0–15.0)
WBC: 5.3 10*3/uL (ref 3.8–10.8)

## 2019-11-07 LAB — ANA: Anti Nuclear Antibody (ANA): NEGATIVE

## 2019-11-07 LAB — TSH: TSH: 0.79 mIU/L (ref 0.40–4.50)

## 2019-11-07 LAB — VITAMIN B12: Vitamin B-12: 493 pg/mL (ref 200–1100)

## 2019-11-07 LAB — C-REACTIVE PROTEIN: CRP: 21.3 mg/L — ABNORMAL HIGH (ref <8.0)

## 2019-11-07 LAB — AMMONIA: Ammonia: 180 umol/L — ABNORMAL HIGH (ref <=72)

## 2019-11-07 LAB — RPR: RPR Ser Ql: NONREACTIVE

## 2019-11-07 LAB — CK: Total CK: 179 U/L (ref 44–196)

## 2019-11-09 ENCOUNTER — Telehealth: Payer: Self-pay

## 2019-11-09 LAB — ETHANOL: Ethanol: 0.103 %

## 2019-11-09 NOTE — Telephone Encounter (Signed)
lab   results   faxed  .

## 2019-12-06 DIAGNOSIS — Z1329 Encounter for screening for other suspected endocrine disorder: Secondary | ICD-10-CM | POA: Diagnosis not present

## 2019-12-06 DIAGNOSIS — Z5181 Encounter for therapeutic drug level monitoring: Secondary | ICD-10-CM | POA: Diagnosis not present

## 2019-12-06 DIAGNOSIS — I10 Essential (primary) hypertension: Secondary | ICD-10-CM | POA: Diagnosis not present

## 2019-12-06 DIAGNOSIS — E6609 Other obesity due to excess calories: Secondary | ICD-10-CM | POA: Diagnosis not present

## 2019-12-06 DIAGNOSIS — E1165 Type 2 diabetes mellitus with hyperglycemia: Secondary | ICD-10-CM | POA: Diagnosis not present

## 2019-12-06 DIAGNOSIS — Z1389 Encounter for screening for other disorder: Secondary | ICD-10-CM | POA: Diagnosis not present

## 2019-12-06 DIAGNOSIS — E1142 Type 2 diabetes mellitus with diabetic polyneuropathy: Secondary | ICD-10-CM | POA: Diagnosis not present

## 2019-12-06 DIAGNOSIS — Z136 Encounter for screening for cardiovascular disorders: Secondary | ICD-10-CM | POA: Diagnosis not present

## 2019-12-06 DIAGNOSIS — E782 Mixed hyperlipidemia: Secondary | ICD-10-CM | POA: Diagnosis not present

## 2019-12-06 DIAGNOSIS — Z131 Encounter for screening for diabetes mellitus: Secondary | ICD-10-CM | POA: Diagnosis not present

## 2019-12-06 DIAGNOSIS — Z125 Encounter for screening for malignant neoplasm of prostate: Secondary | ICD-10-CM | POA: Diagnosis not present

## 2019-12-06 DIAGNOSIS — K219 Gastro-esophageal reflux disease without esophagitis: Secondary | ICD-10-CM | POA: Diagnosis not present

## 2020-01-09 DIAGNOSIS — K219 Gastro-esophageal reflux disease without esophagitis: Secondary | ICD-10-CM | POA: Diagnosis not present

## 2020-01-09 DIAGNOSIS — R413 Other amnesia: Secondary | ICD-10-CM | POA: Diagnosis not present

## 2020-01-09 DIAGNOSIS — E6609 Other obesity due to excess calories: Secondary | ICD-10-CM | POA: Diagnosis not present

## 2020-01-09 DIAGNOSIS — K921 Melena: Secondary | ICD-10-CM | POA: Diagnosis not present

## 2020-01-09 DIAGNOSIS — E1165 Type 2 diabetes mellitus with hyperglycemia: Secondary | ICD-10-CM | POA: Diagnosis not present

## 2020-01-09 DIAGNOSIS — F5104 Psychophysiologic insomnia: Secondary | ICD-10-CM | POA: Diagnosis not present

## 2020-01-09 DIAGNOSIS — I10 Essential (primary) hypertension: Secondary | ICD-10-CM | POA: Diagnosis not present

## 2020-01-09 DIAGNOSIS — E782 Mixed hyperlipidemia: Secondary | ICD-10-CM | POA: Diagnosis not present

## 2020-01-09 DIAGNOSIS — M6281 Muscle weakness (generalized): Secondary | ICD-10-CM | POA: Diagnosis not present

## 2020-01-09 DIAGNOSIS — E1142 Type 2 diabetes mellitus with diabetic polyneuropathy: Secondary | ICD-10-CM | POA: Diagnosis not present

## 2020-02-06 ENCOUNTER — Encounter: Payer: Self-pay | Admitting: *Deleted

## 2020-02-07 ENCOUNTER — Encounter: Payer: Self-pay | Admitting: *Deleted

## 2020-02-08 ENCOUNTER — Ambulatory Visit: Payer: Medicaid Other | Admitting: Diagnostic Neuroimaging

## 2020-02-08 ENCOUNTER — Encounter: Payer: Self-pay | Admitting: Diagnostic Neuroimaging

## 2020-04-09 DIAGNOSIS — K219 Gastro-esophageal reflux disease without esophagitis: Secondary | ICD-10-CM | POA: Diagnosis not present

## 2020-04-09 DIAGNOSIS — Z5181 Encounter for therapeutic drug level monitoring: Secondary | ICD-10-CM | POA: Diagnosis not present

## 2020-04-09 DIAGNOSIS — R440 Auditory hallucinations: Secondary | ICD-10-CM | POA: Diagnosis not present

## 2020-04-09 DIAGNOSIS — E782 Mixed hyperlipidemia: Secondary | ICD-10-CM | POA: Diagnosis not present

## 2020-04-09 DIAGNOSIS — I10 Essential (primary) hypertension: Secondary | ICD-10-CM | POA: Diagnosis not present

## 2020-04-09 DIAGNOSIS — E6609 Other obesity due to excess calories: Secondary | ICD-10-CM | POA: Diagnosis not present

## 2020-04-09 DIAGNOSIS — E1165 Type 2 diabetes mellitus with hyperglycemia: Secondary | ICD-10-CM | POA: Diagnosis not present

## 2020-04-09 DIAGNOSIS — F5104 Psychophysiologic insomnia: Secondary | ICD-10-CM | POA: Diagnosis not present

## 2020-04-09 DIAGNOSIS — E1142 Type 2 diabetes mellitus with diabetic polyneuropathy: Secondary | ICD-10-CM | POA: Diagnosis not present

## 2020-04-09 DIAGNOSIS — R413 Other amnesia: Secondary | ICD-10-CM | POA: Diagnosis not present

## 2020-05-05 ENCOUNTER — Ambulatory Visit: Payer: Medicaid Other | Admitting: Diagnostic Neuroimaging

## 2020-05-05 ENCOUNTER — Encounter: Payer: Self-pay | Admitting: Diagnostic Neuroimaging

## 2020-05-05 ENCOUNTER — Telehealth: Payer: Self-pay | Admitting: *Deleted

## 2020-05-05 NOTE — Telephone Encounter (Signed)
Patient was no show x 2  for new patient appointment today.

## 2020-05-15 ENCOUNTER — Emergency Department (HOSPITAL_COMMUNITY)
Admission: EM | Admit: 2020-05-15 | Discharge: 2020-05-16 | Disposition: A | Discharge status: Home / Self Care | Payer: Medicaid Other | Attending: Emergency Medicine | Admitting: Emergency Medicine

## 2020-05-15 ENCOUNTER — Encounter (HOSPITAL_COMMUNITY): Payer: Self-pay

## 2020-05-15 DIAGNOSIS — F17290 Nicotine dependence, other tobacco product, uncomplicated: Secondary | ICD-10-CM | POA: Insufficient documentation

## 2020-05-15 DIAGNOSIS — K828 Other specified diseases of gallbladder: Secondary | ICD-10-CM | POA: Insufficient documentation

## 2020-05-15 DIAGNOSIS — I10 Essential (primary) hypertension: Secondary | ICD-10-CM | POA: Diagnosis not present

## 2020-05-15 DIAGNOSIS — K8689 Other specified diseases of pancreas: Secondary | ICD-10-CM | POA: Diagnosis not present

## 2020-05-15 DIAGNOSIS — K859 Acute pancreatitis without necrosis or infection, unspecified: Secondary | ICD-10-CM | POA: Diagnosis not present

## 2020-05-15 DIAGNOSIS — Z79899 Other long term (current) drug therapy: Secondary | ICD-10-CM | POA: Insufficient documentation

## 2020-05-15 DIAGNOSIS — E119 Type 2 diabetes mellitus without complications: Secondary | ICD-10-CM | POA: Insufficient documentation

## 2020-05-15 DIAGNOSIS — K8000 Calculus of gallbladder with acute cholecystitis without obstruction: Secondary | ICD-10-CM | POA: Diagnosis not present

## 2020-05-15 DIAGNOSIS — D1809 Hemangioma of other sites: Secondary | ICD-10-CM | POA: Diagnosis not present

## 2020-05-15 DIAGNOSIS — R109 Unspecified abdominal pain: Secondary | ICD-10-CM

## 2020-05-15 DIAGNOSIS — Q441 Other congenital malformations of gallbladder: Secondary | ICD-10-CM

## 2020-05-15 DIAGNOSIS — K760 Fatty (change of) liver, not elsewhere classified: Secondary | ICD-10-CM | POA: Diagnosis not present

## 2020-05-15 DIAGNOSIS — Z7984 Long term (current) use of oral hypoglycemic drugs: Secondary | ICD-10-CM | POA: Diagnosis not present

## 2020-05-15 LAB — URINALYSIS, ROUTINE W REFLEX MICROSCOPIC
Bilirubin Urine: NEGATIVE
Glucose, UA: NEGATIVE mg/dL
Hgb urine dipstick: NEGATIVE
Ketones, ur: NEGATIVE mg/dL
Leukocytes,Ua: NEGATIVE
Nitrite: NEGATIVE
Protein, ur: NEGATIVE mg/dL
Specific Gravity, Urine: 1.011 (ref 1.005–1.030)
pH: 5 (ref 5.0–8.0)

## 2020-05-15 NOTE — ED Triage Notes (Signed)
Pt comes via [facility], for n/v, pt reports abd hernia but does not know how long it has been there. Pt has heavy ETOH on board

## 2020-05-16 ENCOUNTER — Emergency Department (HOSPITAL_COMMUNITY): Payer: Medicaid Other

## 2020-05-16 DIAGNOSIS — K8000 Calculus of gallbladder with acute cholecystitis without obstruction: Secondary | ICD-10-CM | POA: Diagnosis not present

## 2020-05-16 DIAGNOSIS — D1809 Hemangioma of other sites: Secondary | ICD-10-CM | POA: Diagnosis not present

## 2020-05-16 DIAGNOSIS — K760 Fatty (change of) liver, not elsewhere classified: Secondary | ICD-10-CM | POA: Diagnosis not present

## 2020-05-16 DIAGNOSIS — K8689 Other specified diseases of pancreas: Secondary | ICD-10-CM | POA: Diagnosis not present

## 2020-05-16 LAB — CBC
HCT: 37.2 % — ABNORMAL LOW (ref 39.0–52.0)
Hemoglobin: 12.9 g/dL — ABNORMAL LOW (ref 13.0–17.0)
MCH: 29.7 pg (ref 26.0–34.0)
MCHC: 34.7 g/dL (ref 30.0–36.0)
MCV: 85.5 fL (ref 80.0–100.0)
Platelets: 83 10*3/uL — ABNORMAL LOW (ref 150–400)
RBC: 4.35 MIL/uL (ref 4.22–5.81)
RDW: 13.9 % (ref 11.5–15.5)
WBC: 5.2 10*3/uL (ref 4.0–10.5)
nRBC: 0 % (ref 0.0–0.2)

## 2020-05-16 LAB — COMPREHENSIVE METABOLIC PANEL
ALT: 83 U/L — ABNORMAL HIGH (ref 0–44)
AST: 158 U/L — ABNORMAL HIGH (ref 15–41)
Albumin: 3.4 g/dL — ABNORMAL LOW (ref 3.5–5.0)
Alkaline Phosphatase: 151 U/L — ABNORMAL HIGH (ref 38–126)
Anion gap: 12 (ref 5–15)
BUN: 8 mg/dL (ref 6–20)
CO2: 23 mmol/L (ref 22–32)
Calcium: 8.3 mg/dL — ABNORMAL LOW (ref 8.9–10.3)
Chloride: 97 mmol/L — ABNORMAL LOW (ref 98–111)
Creatinine, Ser: 1.13 mg/dL (ref 0.61–1.24)
GFR calc Af Amer: >60 mL/min (ref >60)
GFR calc non Af Amer: >60 mL/min (ref >60)
Glucose, Bld: 142 mg/dL — ABNORMAL HIGH (ref 70–99)
Potassium: 4.1 mmol/L (ref 3.5–5.1)
Sodium: 132 mmol/L — ABNORMAL LOW (ref 135–145)
Total Bilirubin: 1.7 mg/dL — ABNORMAL HIGH (ref 0.3–1.2)
Total Protein: 7.4 g/dL (ref 6.5–8.1)

## 2020-05-16 LAB — LIPASE, BLOOD: Lipase: 35 U/L (ref 11–51)

## 2020-05-16 MED ORDER — ONDANSETRON HCL 4 MG PO TABS: 4.0000 mg | ORAL_TABLET | Freq: Four times a day (QID) | ORAL | 0 refills | Status: DC

## 2020-05-16 MED ORDER — IOHEXOL 300 MG/ML  SOLN
100.0000 mL | Freq: Once | INTRAMUSCULAR | Status: AC | PRN
  Administered 2020-05-16: 100 mL via INTRAVENOUS

## 2020-05-16 NOTE — ED Provider Notes (Signed)
[facility] EMERGENCY DEPARTMENT Provider Note   CSN: [id number] Arrival date & time: 05/15/20  2300     History Chief Complaint  Patient presents with  . Nausea    Jimmy Chandler is a 49 y.o. male.  HPI    Pt is a 49 y/o M with a h/o DM, elevated LFTs, HLD, HTN, parotid cancer, who presents to the ED today for eval of abdominal pain nausea and vomiting.  States symptoms have been present for the last 3 days.  At the time of my evaluation patient had been in the emergency department for about 10 hours and states that his symptoms have largely improved.  He denies any continued vomiting or significant abdominal pain.  He does report some swelling to the umbilicus from his hernia which she states he has intermittently.  He denies any fevers or urinary symptoms.  Denies any diarrhea or constipation.  Past Medical History:  Diagnosis Date  . Diabetes mellitus without complication ([place])    type 2  . Diabetic polyneuropathy ([place])   . Elevated LFTs   . Hematochezia   . History of parotid cancer   . Hyperlipidemia   . Hypertension   . Memory loss   . Muscle weakness of lower extremity   . Psychophysiological insomnia     Patient Active Problem List   Diagnosis Date Noted  . 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 History of radiation to head and neck region 10/31/2019  . Generalized edema 09/27/2019  . 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 INCISION AND DRAINAGE DEEP NECK ABSCESS    . PAROTIDECTOMY         Family History  Problem Relation Age of Onset  . Cancer Mother   . Hypertension Father     Social History   Tobacco Use  . Smoking status: Current Every Day Smoker    Types: Cigars  . Smokeless tobacco: Never Used  Substance Use Topics  . Alcohol use: Yes    Alcohol/week: 14.0 standard drinks    Types: 14 Cans of beer per week  .  Drug use: Never    Home Medications Prior to Admission medications   Medication Sig Start Date End Date Taking? Authorizing Provider  atorvastatin (LIPITOR) 80 MG tablet Take 80 mg by mouth daily.  05/08/19  Yes [provider]  fluticasone (FLONASE) 50 MCG/ACT nasal spray Place 2 sprays into both nostrils daily. Patient taking differently: Place 2 sprays into both nostrils as needed for allergies.  10/21/18  Yes Fawze, Mina A, PA-C  furosemide (LASIX) 20 MG tablet Take 1 tablet (20 mg total) by mouth daily. Patient taking differently: Take 20 mg by mouth as needed for fluid.  09/27/19  Yes Wardell Honour, MD  gabapentin (NEURONTIN) 300 MG capsule Take 300 mg by mouth 3 (three) times daily.   Yes [provider]  glipiZIDE (GLUCOTROL) 10 MG tablet Take 10 mg by mouth daily before breakfast.   Yes [provider]  metFORMIN (GLUCOPHAGE) 500 MG tablet Take 1 tablet (500 mg total) by mouth 2 (two) times daily with a meal for 30 days. 03/10/19 05/16/20 Yes Lorin Glass, PA-C  mirtazapine (REMERON) 15 MG tablet Take 15 mg by mouth at bedtime. 04/09/20  Yes [provider]  pantoprazole (PROTONIX) 40 MG tablet Take 40 mg by mouth daily.  05/02/19  Yes  [provider]  sitaGLIPtin (JANUVIA) 100 MG tablet Take 100 mg by mouth daily.   Yes [provider]  amoxicillin-clavulanate (AUGMENTIN) 875-125 MG tablet Take 1 tablet by mouth every 12 (twelve) hours. Patient not taking: Reported on 07/22/2019 04/14/19   Ezequiel Essex, MD  ondansetron (ZOFRAN) 4 MG tablet Take 1 tablet (4 mg total) by mouth every 6 (six) hours. 05/16/20   Tomoya Ringwald S, PA-C    Allergies    Patient has no known allergies.  Review of Systems   Review of Systems  Constitutional: Negative for fever.  HENT: Negative for ear pain and sore throat.   Eyes: Negative for visual disturbance.  Respiratory: Negative for cough and shortness of breath.   Cardiovascular: Negative  for chest pain.  Gastrointestinal: Positive for abdominal pain, nausea and vomiting. Negative for blood in stool, constipation and diarrhea.  Genitourinary: Negative for dysuria and hematuria.  Musculoskeletal: Negative for back pain.  Skin: Negative for rash.  Neurological: Negative for headaches.  All other systems reviewed and are negative.   Physical Exam Updated Vital Signs BP (!) 143/95 (BP Location: Right Arm)   Pulse 73   Temp 98.3 F (36.8 C) (Oral)   Resp 19   SpO2 98%   Physical Exam Vitals and nursing note reviewed.  Constitutional:      Appearance: He is well-developed.  HENT:     Head: Normocephalic and atraumatic.  Eyes:     Conjunctiva/sclera: Conjunctivae normal.  Cardiovascular:     Rate and Rhythm: Normal rate and regular rhythm.     Heart sounds: Normal heart sounds. No murmur heard.   Pulmonary:     Effort: Pulmonary effort is normal. No respiratory distress.     Breath sounds: Normal breath sounds. No wheezing, rhonchi or rales.  Abdominal:     General: Bowel sounds are normal.     Palpations: Abdomen is soft.     Tenderness: There is no abdominal tenderness. There is no guarding or rebound.     Hernia: A hernia (umbilical hernia present (pt reports some TTP but does not appear to be in any significant discomfort)) is present.  Musculoskeletal:     Cervical back: Neck supple.  Skin:    General: Skin is warm and dry.  Neurological:     Mental Status: He is alert.     ED Results / Procedures / Treatments   Labs (all labs ordered are listed, but only abnormal results are displayed) Labs Reviewed  COMPREHENSIVE METABOLIC PANEL - Abnormal; Notable for the following components:      Result Value   Sodium 132 (*)    Chloride 97 (*)    Glucose, Bld 142 (*)    Calcium 8.3 (*)    Albumin 3.4 (*)    AST 158 (*)    ALT 83 (*)    Alkaline Phosphatase 151 (*)    Total Bilirubin 1.7 (*)    All other components within normal limits  CBC - Abnormal;  Notable for the following components:   Hemoglobin 12.9 (*)    HCT 37.2 (*)    Platelets 83 (*)    All other components within normal limits  LIPASE, BLOOD  URINALYSIS, ROUTINE W REFLEX MICROSCOPIC    EKG None  Radiology CT ABDOMEN PELVIS W CONTRAST  Result Date: 05/16/2020 CLINICAL DATA:  Abdominal pain, nausea, vomiting EXAM: CT ABDOMEN AND PELVIS WITH CONTRAST TECHNIQUE: Multidetector CT imaging of the abdomen and pelvis was performed using the standard protocol  following bolus administration of intravenous contrast. CONTRAST:  171mL OMNIPAQUE IOHEXOL 300 MG/ML  SOLN COMPARISON:  None. FINDINGS: Lower chest: No acute abnormality. Hepatobiliary: Hepatic steatosis. Subcapsular hemangioma of the anterolateral liver dome measuring 3.5 x 3.3 cm demonstrating characteristic peripheral nodular pattern of enhancement (series 3, image 15). No gallstones. Gallbladder wall thickening and probable trace pericholecystic fluid. No biliary ductal dilatation. Pancreas: There is mild fat stranding about the pancreatic head and adjacent retroperitoneum (series 3, image 36) Spleen: Normal in size without significant abnormality. Adrenals/Urinary Tract: Adrenal glands are unremarkable. Kidneys are normal, without renal calculi, solid lesion, or hydronephrosis. Bladder is unremarkable. Stomach/Bowel: Stomach is within normal limits. Appendix appears normal. No evidence of bowel wall thickening, distention, or inflammatory changes. Vascular/Lymphatic: No significant vascular findings are present. No enlarged abdominal or pelvic lymph nodes. Reproductive: No mass or other significant abnormality. Other: No abdominal wall hernia or abnormality. Small volume ascites. Musculoskeletal: No acute or significant osseous findings. IMPRESSION: 1. There is mild fat stranding about the pancreatic head and adjacent retroperitoneum, suggestive of acute pancreatitis. Correlate with biochemical findings. No evidence of acute pancreatic  fluid collection. 2. Gallbladder wall thickening and probable trace pericholecystic fluid. No gallstones. No biliary ductal dilatation. This is nonspecific in the setting of adjacent inflammation and ascites, but acute cholecystitis (i.e. cholecystitis/choledocholithiasis and gallstone pancreatitis) is a differential consideration. Ultrasound and/or MRI may be helpful to further evaluate. 3. Hepatic steatosis. 4. Definitively benign, incidental subcapsular hemangioma of the anterolateral liver dome measuring 3.5 x 3.3 cm. 5. Small volume ascites. Electronically Signed   By: Eddie Candle M.D.   On: 05/16/2020 12:03    Procedures Procedures (including critical care time)  Medications Ordered in ED Medications  iohexol (OMNIPAQUE) 300 MG/ML solution 100 mL (100 mLs Intravenous Contrast Given 05/16/20 1147)    ED Course  I have reviewed the triage vital signs and the nursing notes.  Pertinent labs & imaging results that were available during my care of the patient were reviewed by me and considered in my medical decision making (see chart for details).    MDM Rules/Calculators/A&P                          49 y/o M presenting to the ED today for eval of abd pain, NV. At the time of my eval, pt had been in the department for 10 hours. He states that at this time his sxs are largely resolved.   His abd is generally soft but he does have some mild ttp to his umbilical hernia. Vs are wnl.   Reviewed/interpreted labs CBC w/o leukocytosis, anemia present which is mild. Also with thrombycytopenia - likely related to chronic etoh use CMP with mild hyponatremia and hypochloremia, LFTs elevated but appears chornic, mildly elevated bilirubin - again likely related to etoh use Lipase neg UA w/o evidence of UTI  CT abd/pelvis  1. There is mild fat stranding about the pancreatic head and adjacent retroperitoneum, suggestive of acute pancreatitis. Correlate with biochemical findings. No evidence of acute  pancreatic fluid collection. 2. Gallbladder wall thickening and probable trace pericholecystic fluid. No gallstones. No biliary ductal dilatation. This is nonspecific in the setting of adjacent inflammation and ascites, but acute cholecystitis (i.e. cholecystitis/choledocholithiasis and gallstone pancreatitis) is a differential consideration. Ultrasound and/or MRI may be helpful to further evaluate. 3. Hepatic steatosis. 4. Definitively benign, incidental subcapsular hemangioma of the anterolateral liver dome measuring 3.5 x 3.3 cm. 5. Small volume ascites.  I reassessed the patient and discussed the results of the CT scan showing changes to the gallbladder and pancreas.  He has been able to tolerate p.o. and eat crackers.  He states he is feeling improved.  I did discuss that I would recommend a right upper quadrant ultrasound to definitively rule out cholecystitis given his elevated liver enzymes, vomiting and recent abdominal pain.  He states that he left his dog at home and he would prefer to leave AMA.  I discussed the risks of being discharged prior to the completion of his work-up including overwhelming infection, permanent disability and death.  Patient voices understanding of this but ultimately still wants to leave AMA.  I advised that if he would like to return for completion of his work-up we be happy to treat him at any time.  I will discharge him with Zofran.  I also gave him GI and general surgery follow-up and advised him to follow-up with his PCP as well.  He voiced understanding.  All questions were answered.  Final Clinical Impression(s) / ED Diagnoses Final diagnoses:  Gallbladder anomaly  Abdominal pain, unspecified abdominal location  Acute pancreatitis, unspecified complication status, unspecified pancreatitis type    Rx / DC Orders ED Discharge Orders         Ordered    ondansetron (ZOFRAN) 4 MG tablet  Every 6 hours        05/16/20 [address] Kenslee Achorn S,  PA-C 05/16/20 1249    Gareth Morgan, MD 05/19/20 1028

## 2020-05-16 NOTE — Discharge Instructions (Addendum)
The CT scan today of your abdomen showed some inflammation in your pancreas but also showed some swelling and inflammation near your gallbladder.  We recommended that you obtain a right upper quadrant ultrasound to further evaluate your gallbladder and rule out a gallbladder problem such as cholecystitis however you declined.  If you choose to return to the emergency department for the remainder of this work-up we would be more than happy to further evaluate and treat you.  You were given a prescription for zofran to help with your nausea. Please take as directed.  Please follow up with your primary doctor within the next 5-7 days.  You were also given information to follow-up with a general surgeon in regards to your gallbladder abnormality and you were given information to follow-up with a gastroenterologist in regards to the inflammation around your pancreas today.  Please call the office to schedule an appointment for follow-up with these providers.   Please return to the ER sooner if you have any new or worsening symptoms, or if you have any of the following symptoms:  Abdominal pain that does not go away.  You have a fever.  You keep throwing up (vomiting).  The pain is felt only in portions of the abdomen. Pain in the right side could possibly be appendicitis. In an adult, pain in the left lower portion of the abdomen could be colitis or diverticulitis.  You pass bloody or black tarry stools.  There is bright red blood in the stool.  The constipation stays for more than 4 days.  There is belly (abdominal) or rectal pain.  You do not seem to be getting better.  You have any questions or concerns.

## 2020-05-16 NOTE — ED Notes (Signed)
After speaking P and Myself Pt.decided to  Leave AMA.He said he was fine.

## 2020-05-16 NOTE — ED Notes (Signed)
Pt has half eaten chicken wings at bedside- states only pain is in feet. No abnormalities or wounds noted. Pt reports he drinks 1-2 40 oz beers a day.

## 2020-06-17 DIAGNOSIS — E1165 Type 2 diabetes mellitus with hyperglycemia: Secondary | ICD-10-CM | POA: Diagnosis not present

## 2020-07-21 ENCOUNTER — Ambulatory Visit: Payer: Self-pay | Admitting: Surgery

## 2020-07-21 DIAGNOSIS — K429 Umbilical hernia without obstruction or gangrene: Secondary | ICD-10-CM | POA: Diagnosis not present

## 2020-07-21 NOTE — H&P (Signed)
Jimmy Chandler Appointment: 07/21/2020 10:10 AM Location: [facility] Patient #: [id number] DOB: 1970/10/14 Single / Language: Jimmy Chandler / Race: Black or African American Male  History of Present Illness Jimmy Chandler A. Chaela Branscum MD; 07/21/2020 10:42 AM) Patient words: Patient presents for evaluation of small umbilical hernia. Of note is evaluated in September 2021 for epigastric abdominal pain. He was seen in the [facility] Emergency room in September due to epigastric abdominal pain. CT showed a thickened gallbladder wall without stones, no ascites or evidence of sclerosis. He is a chronic consumer of alcoholic beverages. He drinks hard liquor in the past but states he is wished to pull B". He drinks 1-2 beers a day states. He has no more epigastric abdominal pain. He was sent due to a small umbilical hernia by his primary care provider. It is getting larger and he has an interest in getting it repaired. I reviewed his computed tomography scan which shows no evidence of ascites and he has relatively well-preserved liver function with a total bilirubin of 1.7. His transaminase levels show mild elevation to 154 aspartate aminotransferase and 83 for ALT. His alkaline phosphatase phosphatase is 151 which is improved from blood drawn in 2020. Creatinine 1.13. He has never had withdrawal symptoms he states. He has no nausea or vomiting currently.  The patient is a 49 year old male.   Diagnostic Studies History (Jimmy Chandler, [place]; 07/21/2020 10:18 AM) Colonoscopy never  Allergies (Jimmy Chandler, [place]; 07/21/2020 10:18 AM) No Known Drug Allergies [07/21/2020]: Allergies Reconciled  Medication History (Jimmy Chandler, [place]; 07/21/2020 10:19 AM) Escitalopram Oxalate (10MG  Tablet, Oral) Active. Furosemide (20MG  Tablet, Oral) Active. Gabapentin (300MG  Capsule, Oral) Active. metFORMIN HCl (500MG  Tablet, Oral) Active. Atorvastatin Calcium (80MG  Tablet, Oral) Active. Mirtazapine  (15MG  Tablet, Oral) Active. Pantoprazole Sodium (40MG  Tablet DR, Oral) Active. Medications Reconciled  Social History (Jimmy Chandler, [place]; 07/21/2020 10:18 AM) Alcohol use Occasional alcohol use. Caffeine use Coffee. Tobacco use Current some day smoker.  Family History (Jimmy Chandler, [place]; 07/21/2020 10:18 AM) Breast Cancer Sister. Colon Cancer Mother. Heart Disease Father.  Other Problems (Jimmy Chandler, [place]; 07/21/2020 10:18 AM) Back Pain Depression Diabetes Mellitus Hypercholesterolemia     Review of Systems (Jimmy Chandler; 07/21/2020 10:18 AM) General Not Present- Appetite Loss, Chills, Fatigue, Fever, Night Sweats, Weight Gain and Weight Loss. Skin Not Present- Change in Wart/Mole, Dryness, Hives, Jaundice, New Lesions, Non-Healing Wounds, Rash and Ulcer. HEENT Present- Hearing Loss and Wears glasses/contact lenses. Not Present- Earache, Hoarseness, Nose Bleed, Oral Ulcers, Ringing in the Ears, Seasonal Allergies, Sinus Pain, Sore Throat, Visual Disturbances and Yellow Eyes. Respiratory Not Present- Bloody sputum, Chronic Cough, Difficulty Breathing, Snoring and Wheezing. Breast Not Present- Breast Mass, Breast Pain, Nipple Discharge and Skin Changes. Cardiovascular Present- Leg Cramps. Not Present- Chest Pain, Difficulty Breathing Lying Down, Palpitations, Rapid Heart Rate, Shortness of Breath and Swelling of Extremities. Gastrointestinal Not Present- Abdominal Pain, Bloating, Bloody Stool, Change in Bowel Habits, Chronic diarrhea, Constipation, Difficulty Swallowing, Excessive gas, Gets full quickly at meals, Hemorrhoids, Indigestion, Nausea, Rectal Pain and Vomiting. Male Genitourinary Not Present- Blood in Urine, Change in Urinary Stream, Frequency, Impotence, Nocturia, Painful Urination, Urgency and Urine Leakage. Musculoskeletal Present- Back Pain, Joint Pain and Muscle Pain. Not Present- Joint Stiffness, Muscle Weakness and Swelling of  Extremities. Neurological Present- Tingling, Trouble walking and Weakness. Not Present- Decreased Memory, Fainting, Headaches, Numbness, Seizures and Tremor. Psychiatric Present- Change in Sleep Pattern and Depression. Not Present- Anxiety, Bipolar, Fearful and Frequent crying.  Endocrine Not Present- Cold Intolerance, Excessive Hunger, Hair Changes, Heat Intolerance, Hot flashes and New Diabetes. Hematology Not Present- Blood Thinners, Easy Bruising, Excessive bleeding, Gland problems, HIV and Persistent Infections.  Vitals (Jimmy Chandler; 07/21/2020 10:19 AM) 07/21/2020 10:19 AM Weight: 196.4 lb Height: 68in Body Surface Area: 2.03 m Body Mass Index: 29.86 kg/m  Temp.: 35F  Pulse: 105 (Regular)  BP: 148/92(Sitting, Left Arm, Standard)        Physical Exam (Jimmy Baines A. Zaela Graley MD; 07/21/2020 10:42 AM)  General Mental Status-Alert. General Appearance-Consistent with stated age. Hydration-Well hydrated. Voice-Normal.  Head and Neck Head-normocephalic, atraumatic with no lesions or palpable masses. Trachea-midline. Thyroid Gland Characteristics - normal size and consistency.  Eye Eyeball - Bilateral-Extraocular movements intact. Sclera/Conjunctiva - Bilateral-No scleral icterus.  Chest and Lung Exam Chest and lung exam reveals -quiet, even and easy respiratory effort with no use of accessory muscles and on auscultation, normal breath sounds, no adventitious sounds and normal vocal resonance. Inspection Chest Wall - Normal. Back - normal.  Breast Breast - Left-Symmetric, Non Tender, No Biopsy scars, no Dimpling - Left, No Inflammation, No Lumpectomy scars, No Mastectomy scars, No Peau d' Orange. Breast - Right-Symmetric, Non Tender, No Biopsy scars, no Dimpling - Right, No Inflammation, No Lumpectomy scars, No Mastectomy scars, No Peau d' Orange. Breast Lump-No Palpable Breast Mass.  Cardiovascular Cardiovascular examination reveals  -normal heart sounds, regular rate and rhythm with no murmurs and normal pedal pulses bilaterally.  Abdomen Inspection Inspection of the abdomen reveals - No Hernias. Skin - Scar - no surgical scars. Palpation/Percussion Palpation and Percussion of the abdomen reveal - Soft, Non Tender, No Rebound tenderness, No Rigidity (guarding) and No hepatosplenomegaly. Auscultation Auscultation of the abdomen reveals - Bowel sounds normal. Note: Small reducible umbilical hernia. No evidence of ascites. Soft nontender. Negative Murphy sign.  Neurologic Neurologic evaluation reveals -alert and oriented x 3 with no impairment of recent or remote memory. Mental Status-Normal.  Musculoskeletal Normal Exam - Left-Upper Extremity Strength Normal and Lower Extremity Strength Normal. Normal Exam - Right-Upper Extremity Strength Normal and Lower Extremity Strength Normal.  Lymphatic Head & Neck  General Head & Neck Lymphatics: Bilateral - Description - Normal. Axillary  General Axillary Region: Bilateral - Description - Normal. Tenderness - Non Tender. Femoral & Inguinal  Generalized Femoral & Inguinal Lymphatics: Bilateral - Description - Normal. Tenderness - Non Tender.    Assessment & Plan (Massiah Longanecker A. Tashawn Laswell MD; 96/10/9526 41:32 AM)  UMBILICAL HERNIA WITHOUT OBSTRUCTION AND WITHOUT GANGRENE (K42.9) Impression: Discussed operative intervention versus observation. He would like to have the umbilical hernia repaired. He has no evidence of ascites. He has no evidence of cirrhosis. I discussed the concern I had for his alcohol consumption but his liver function studies are relatively normal and the hernia is really starting to bother him. I discussed potential withdrawal symptoms. He is aware of all this and the risk poses for surgery. He would like to proceed with repaired symptoms and I cautioned him that he may need to reduce his alcohol consumption or stain but that poses a risk of  withdrawal on his case which could be life threatening. He understands that risk. The risk of hernia repair include bleeding, infection, organ injury, bowel injury, bladder injury, nerve injury recurrent hernia, blood clots, worsening of underlying condition, chronic pain, mesh use, open surgery, death, and the need for other operattions. Pt agrees to proceed  Current Plans You are being scheduled for surgery- Our schedulers will call you.  You should hear from our office's scheduling department within 5 working days about the location, date, and time of surgery. We try to make accommodations for patient's preferences in scheduling surgery, but sometimes the OR schedule or the surgeon's schedule prevents [place] from making those accommodations.  If you have not heard from our office [phone]) in 5 working days, call the office and ask for your surgeon's nurse.  If you have other questions about your diagnosis, plan, or surgery, call the office and ask for your surgeon's nurse.  Pt Education - CCS Mesh education: discussed with patient and provided information. CCS Consent - Hernia Repair - Ventral/Incisional/Umbilical (Gross): discussed with patient and provided information.  ETOH DEPENDENCE (F10.20) Impression: Discussed the risks of excessive alcohol intake and surgery and potential withdrawal. This will be outpatient so without reduce his risk of going into withdrawal like symptoms. He may have to abstain completely to have surgery done which I discussed with him but it sounds like he has cut his drinking down over the years but difficult to discern in this circumstance

## 2020-12-04 ENCOUNTER — Other Ambulatory Visit: Payer: Self-pay

## 2020-12-04 ENCOUNTER — Encounter (HOSPITAL_COMMUNITY): Payer: Self-pay | Admitting: Emergency Medicine

## 2020-12-04 ENCOUNTER — Emergency Department (HOSPITAL_COMMUNITY): Payer: Medicare HMO

## 2020-12-04 ENCOUNTER — Emergency Department (HOSPITAL_COMMUNITY)
Admission: EM | Admit: 2020-12-04 | Discharge: 2020-12-04 | Disposition: A | Discharge status: Home / Self Care | Payer: Medicare HMO | Attending: Emergency Medicine | Admitting: Emergency Medicine

## 2020-12-04 DIAGNOSIS — I10 Essential (primary) hypertension: Secondary | ICD-10-CM | POA: Insufficient documentation

## 2020-12-04 DIAGNOSIS — R1013 Epigastric pain: Secondary | ICD-10-CM | POA: Diagnosis present

## 2020-12-04 DIAGNOSIS — R1033 Periumbilical pain: Secondary | ICD-10-CM | POA: Diagnosis not present

## 2020-12-04 DIAGNOSIS — Z7984 Long term (current) use of oral hypoglycemic drugs: Secondary | ICD-10-CM | POA: Insufficient documentation

## 2020-12-04 DIAGNOSIS — F17290 Nicotine dependence, other tobacco product, uncomplicated: Secondary | ICD-10-CM | POA: Insufficient documentation

## 2020-12-04 DIAGNOSIS — R1010 Upper abdominal pain, unspecified: Secondary | ICD-10-CM | POA: Insufficient documentation

## 2020-12-04 DIAGNOSIS — Z85858 Personal history of malignant neoplasm of other endocrine glands: Secondary | ICD-10-CM | POA: Diagnosis not present

## 2020-12-04 DIAGNOSIS — Z79899 Other long term (current) drug therapy: Secondary | ICD-10-CM | POA: Insufficient documentation

## 2020-12-04 DIAGNOSIS — R0789 Other chest pain: Secondary | ICD-10-CM | POA: Insufficient documentation

## 2020-12-04 DIAGNOSIS — E1142 Type 2 diabetes mellitus with diabetic polyneuropathy: Secondary | ICD-10-CM | POA: Diagnosis not present

## 2020-12-04 DIAGNOSIS — R112 Nausea with vomiting, unspecified: Secondary | ICD-10-CM | POA: Insufficient documentation

## 2020-12-04 LAB — CBC WITH DIFFERENTIAL/PLATELET
Abs Immature Granulocytes: 0 10*3/uL (ref 0.00–0.07)
Basophils Absolute: 0.1 10*3/uL (ref 0.0–0.1)
Basophils Relative: 1 %
Eosinophils Absolute: 0.1 10*3/uL (ref 0.0–0.5)
Eosinophils Relative: 1 %
HCT: 42.4 % (ref 39.0–52.0)
Hemoglobin: 14.3 g/dL (ref 13.0–17.0)
Immature Granulocytes: 0 %
Lymphocytes Relative: 19 %
Lymphs Abs: 1 10*3/uL (ref 0.7–4.0)
MCH: 31.7 pg (ref 26.0–34.0)
MCHC: 33.7 g/dL (ref 30.0–36.0)
MCV: 94 fL (ref 80.0–100.0)
Monocytes Absolute: 0.5 10*3/uL (ref 0.1–1.0)
Monocytes Relative: 10 %
Neutro Abs: 3.5 10*3/uL (ref 1.7–7.7)
Neutrophils Relative %: 69 %
Platelets: 113 10*3/uL — ABNORMAL LOW (ref 150–400)
RBC: 4.51 MIL/uL (ref 4.22–5.81)
RDW: 10.8 % — ABNORMAL LOW (ref 11.5–15.5)
WBC: 5.1 10*3/uL (ref 4.0–10.5)
nRBC: 0 % (ref 0.0–0.2)

## 2020-12-04 LAB — I-STAT CHEM 8, ED
BUN: 12 mg/dL (ref 6–20)
Calcium, Ion: 1.1 mmol/L — ABNORMAL LOW (ref 1.15–1.40)
Chloride: 103 mmol/L (ref 98–111)
Creatinine, Ser: 0.8 mg/dL (ref 0.61–1.24)
Glucose, Bld: 231 mg/dL — ABNORMAL HIGH (ref 70–99)
HCT: 44 % (ref 39.0–52.0)
Hemoglobin: 15 g/dL (ref 13.0–17.0)
Potassium: 4.1 mmol/L (ref 3.5–5.1)
Sodium: 135 mmol/L (ref 135–145)
TCO2: 22 mmol/L (ref 22–32)

## 2020-12-04 LAB — BRAIN NATRIURETIC PEPTIDE: B Natriuretic Peptide: 6.7 pg/mL (ref 0.0–100.0)

## 2020-12-04 LAB — LIPASE, BLOOD: Lipase: 42 U/L (ref 11–51)

## 2020-12-04 LAB — ETHANOL: Alcohol, Ethyl (B): <10 mg/dL (ref <10)

## 2020-12-04 LAB — COMPREHENSIVE METABOLIC PANEL
ALT: 27 U/L (ref 0–44)
AST: 41 U/L (ref 15–41)
Albumin: 3.3 g/dL — ABNORMAL LOW (ref 3.5–5.0)
Alkaline Phosphatase: 85 U/L (ref 38–126)
Anion gap: 8 (ref 5–15)
BUN: 9 mg/dL (ref 6–20)
CO2: 22 mmol/L (ref 22–32)
Calcium: 8.8 mg/dL — ABNORMAL LOW (ref 8.9–10.3)
Chloride: 102 mmol/L (ref 98–111)
Creatinine, Ser: 0.91 mg/dL (ref 0.61–1.24)
GFR, Estimated: >60 mL/min (ref >60)
Glucose, Bld: 237 mg/dL — ABNORMAL HIGH (ref 70–99)
Potassium: 4.1 mmol/L (ref 3.5–5.1)
Sodium: 132 mmol/L — ABNORMAL LOW (ref 135–145)
Total Bilirubin: 0.9 mg/dL (ref 0.3–1.2)
Total Protein: 7.1 g/dL (ref 6.5–8.1)

## 2020-12-04 LAB — TROPONIN I (HIGH SENSITIVITY): Troponin I (High Sensitivity): 6 ng/L (ref <18)

## 2020-12-04 MED ORDER — ONDANSETRON 4 MG PO TBDP: 4.0000 mg | ORAL_TABLET | Freq: Three times a day (TID) | ORAL | 0 refills | Status: DC | PRN

## 2020-12-04 MED ORDER — SUCRALFATE 1 G PO TABS: 1.0000 g | ORAL_TABLET | Freq: Three times a day (TID) | ORAL | 0 refills | Status: DC

## 2020-12-04 MED ORDER — PANTOPRAZOLE SODIUM 40 MG PO TBEC: 40.0000 mg | DELAYED_RELEASE_TABLET | Freq: Every day | ORAL | 0 refills | Status: DC

## 2020-12-04 MED ORDER — IOHEXOL 300 MG/ML  SOLN
100.0000 mL | Freq: Once | INTRAMUSCULAR | Status: AC | PRN
  Administered 2020-12-04: 100 mL via INTRAVENOUS

## 2020-12-04 NOTE — ED Provider Notes (Signed)
[facility] EMERGENCY DEPARTMENT Provider Note   CSN: [id number] Arrival date & time: 12/04/20  7341     History Chief Complaint  Patient presents with  . Chest Pain  . Abdominal Pain    Pat [facility] is a 50 y.o. male.  HPI      Jimmy Chandler is a 50 y.o. male, with a history of DM type II, elevated LFTs, hyperlipidemia, HTN, parotid gland cancer, presenting to the ED with abdominal pain beginning about a week ago.  Pain is epigastric and periumbilical, intermittent, aching, currently 5/10, nonradiating currently.  Accompanied by intermittent nausea and vomiting.  He endorses chest discomfort in the lower chest confluent with the upper abdomen.  Last bowel movement was last night and was normal. Patient states he drinks about one 40 ounce beer daily with last drink around 6 PM yesterday.  Last food was yesterday evening as well.  Denies fever/chills, diarrhea, constipation, hematochezia/melena, urinary symptoms, shortness of breath, acute neurologic deficit, or any other complaints.  Past Medical History:  Diagnosis Date  . Diabetes mellitus without complication ([place])    type 2  . Diabetic polyneuropathy ([place])   . Elevated LFTs   . Hematochezia   . History of parotid cancer   . Hyperlipidemia   . Hypertension   . Memory loss   . Muscle weakness of lower extremity   . Psychophysiological insomnia     Patient Active Problem List   Diagnosis Date Noted  . 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 History of radiation to head and neck region 10/31/2019  . Generalized edema 09/27/2019  . Cancer of parotid gland ([place]) 05/09/2013  . 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 INCISION AND DRAINAGE DEEP NECK ABSCESS    . PAROTIDECTOMY         Family History  Problem Relation Age of Onset  . Cancer Mother   . Hypertension Father     Social History   Tobacco Use  . Smoking status:  Current Every Day Smoker    Types: Cigars  . Smokeless tobacco: Never Used  . Tobacco comment: 3 cigars daily  Substance Use Topics  . Alcohol use: Yes    Alcohol/week: 14.0 standard drinks    Types: 14 Cans of beer per week  . Drug use: Never    Home Medications Prior to Admission medications   Medication Sig Start Date End Date Taking? Authorizing Provider  atorvastatin (LIPITOR) 80 MG tablet Take 80 mg by mouth daily.  05/08/19  Yes [provider]  fluticasone (FLONASE) 50 MCG/ACT nasal spray Place 2 sprays into both nostrils daily. Patient taking differently: Place 2 sprays into both nostrils daily as needed for allergies. 10/21/18  Yes Fawze, Mina A, PA-C  gabapentin (NEURONTIN) 300 MG capsule Take 300 mg by mouth 2 (two) times daily.   Yes [provider]  glipiZIDE (GLUCOTROL) 10 MG tablet Take 10 mg by mouth daily before breakfast.   Yes [provider]  metFORMIN (GLUCOPHAGE) 500 MG tablet Take 1 tablet (500 mg total) by mouth 2 (two) times daily with a meal for 30 days. Patient taking differently: Take 500 mg by mouth daily with supper. 03/10/19 05/16/20 Yes Lorin Glass, PA-C  mirtazapine (REMERON) 15 MG tablet Take 15 mg by mouth at bedtime. 04/09/20  Yes [provider]  ondansetron (ZOFRAN ODT) 4 MG disintegrating tablet Take 1 tablet (4 mg total) by mouth  every 8 (eight) hours as needed for nausea or vomiting. 12/04/20  Yes Joy, Shawn C, PA-C  pantoprazole (PROTONIX) 40 MG tablet Take 1 tablet (40 mg total) by mouth daily. 12/04/20 01/03/21 Yes Joy, Shawn C, PA-C  sucralfate (CARAFATE) 1 g tablet Take 1 tablet (1 g total) by mouth 4 (four) times daily -  with meals and at bedtime for 15 days. 12/04/20 12/19/20 Yes Joy, Shawn C, PA-C  furosemide (LASIX) 20 MG tablet Take 1 tablet (20 mg total) by mouth daily. Patient taking differently: Take 20 mg by mouth as needed for fluid.  09/27/19   Wardell Honour, MD    Allergies    Patient has no  known allergies.  Review of Systems   Review of Systems  Constitutional: Negative for chills, diaphoresis and fever.  Respiratory: Negative for shortness of breath.   Cardiovascular: Positive for chest pain. Negative for leg swelling.  Gastrointestinal: Positive for abdominal pain, nausea and vomiting. Negative for blood in stool, constipation and diarrhea.  Genitourinary: Negative for difficulty urinating, dysuria and frequency.  Musculoskeletal: Negative for back pain and neck pain.  Neurological: Negative for dizziness, syncope, weakness, numbness and headaches.  All other systems reviewed and are negative.   Physical Exam Updated Vital Signs BP (!) 151/98 (BP Location: Left Arm)   Pulse 63   Temp 98.5 F (36.9 C) (Oral)   Resp 18   Ht 5\' 6"  (1.676 m)   Wt 100 kg   SpO2 98%   BMI 35.58 kg/m   Physical Exam Vitals and nursing note reviewed.  Constitutional:      General: He is not in acute distress.    Appearance: He is well-developed. He is not diaphoretic.  HENT:     Head: Normocephalic and atraumatic.     Mouth/Throat:     Mouth: Mucous membranes are moist.     Pharynx: Oropharynx is clear.  Eyes:     Conjunctiva/sclera: Conjunctivae normal.     Pupils: Pupils are equal, round, and reactive to light.  Cardiovascular:     Rate and Rhythm: Normal rate and regular rhythm.     Pulses: Normal pulses.          Radial pulses are 2+ on the right side and 2+ on the left side.       Posterior tibial pulses are 2+ on the right side and 2+ on the left side.     Heart sounds: Normal heart sounds.     Comments: Tactile temperature in the extremities appropriate and equal bilaterally. Pulmonary:     Effort: Pulmonary effort is normal. No respiratory distress.     Breath sounds: Normal breath sounds.  Abdominal:     General: Bowel sounds are normal. There is distension.     Palpations: Abdomen is soft.     Tenderness: There is abdominal tenderness in the epigastric area and  periumbilical area. There is no guarding.     Hernia: A hernia is present. Hernia is present in the umbilical area.     Comments: Umbilical hernia noted, but seems to be easily reducible without significant pain.  He states the hernia has been "out" for 2 to 3 weeks. Does not give a direct answer regarding whether or not his abdomen is actually distended beyond its normal level.  He says that it is, but then keeps referring back to the umbilical hernia.  Musculoskeletal:     Cervical back: Normal range of motion and neck supple.  Right lower leg: No edema.     Left lower leg: No edema.     Comments: Full ROM in all extremities and spine. No paraspinal tenderness.   Lymphadenopathy:     Cervical: No cervical adenopathy.  Skin:    General: Skin is warm and dry.  Neurological:     Mental Status: He is alert and oriented to person, place, and time.     Deep Tendon Reflexes: Reflexes are normal and symmetric.     Comments: No sensory deficits. Strength 5/5 in all extremities. No gait disturbance. Cranial nerves III-XII grossly intact. No facial droop.  Psychiatric:        Mood and Affect: Mood and affect normal.        Speech: Speech normal.        Behavior: Behavior normal.     ED Results / Procedures / Treatments   Labs (all labs ordered are listed, but only abnormal results are displayed) Labs Reviewed  COMPREHENSIVE METABOLIC PANEL - Abnormal; Notable for the following components:      Result Value   Sodium 132 (*)    Glucose, Bld 237 (*)    Calcium 8.8 (*)    Albumin 3.3 (*)    All other components within normal limits  CBC WITH DIFFERENTIAL/PLATELET - Abnormal; Notable for the following components:   RDW 10.8 (*)    Platelets 113 (*)    All other components within normal limits  I-STAT CHEM 8, ED - Abnormal; Notable for the following components:   Glucose, Bld 231 (*)    Calcium, Ion 1.10 (*)    All other components within normal limits  LIPASE, BLOOD  ETHANOL  BRAIN  NATRIURETIC PEPTIDE  TROPONIN I (HIGH SENSITIVITY)    EKG EKG Interpretation  Date/Time:  Thursday December 04 2020 06:16:01 EDT Ventricular Rate:  94 PR Interval:    QRS Duration: 95 QT Interval:  369 QTC Calculation: 462 R Axis:   14 Text Interpretation: Sinus rhythm ST elev, probable normal early repol pattern No significant change since 03/09/2019 Confirmed by Veryl Speak [phone]) on 12/04/2020 6:19:13 AM   Radiology DG Chest Port 1 View  Result Date: 12/04/2020 CLINICAL DATA:  Chest and abdominal pain. EXAM: PORTABLE CHEST 1 VIEW COMPARISON:  Chest a 03/09/2019. FINDINGS: Mediastinum and hilar structures normal. Borderline cardiomegaly and pulmonary venous congestion. Low lung volumes with mild bibasilar atelectasis. Mild bibasilar infiltrates/edema cannot be excluded. No pleural effusion or pneumothorax. Surgical clips left neck. IMPRESSION: 1. Borderline cardiomegaly and pulmonary venous congestion. 2. Low lung volumes with mild bibasilar atelectasis. Mild bibasilar infiltrates/edema cannot be excluded. Electronically Signed   By: Marcello Moores  Register   On: 12/04/2020 06:38    CLINICAL DATA: Abdominal pain. Ventral hernia.  EXAM: CT ABDOMEN AND PELVIS WITH CONTRAST  TECHNIQUE: Multidetector CT imaging of the abdomen and pelvis was performed using the standard protocol following bolus administration of intravenous contrast.  CONTRAST: 118mL OMNIPAQUE IOHEXOL 300 MG/ML SOLN  COMPARISON: 05/16/2020  FINDINGS: Lower Chest: No acute findings.  Hepatobiliary: Hepatic cirrhosis is again demonstrated. Recanalization paraumbilical veins is again seen, consistent with portal venous hypertension. A mass measuring approximately 4 cm is again seen in the lateral liver dome which shows no significant change in size compared to recent exam. This does show peripheral nodular enhancement along its lateral aspect but the area of this lesion is not seen on delayed phase imaging. This could  represent a benign hemangioma, however hepatocellular carcinoma cannot definitely be excluded in the setting  of cirrhosis. A smaller subtle lesion is seen in the lateral segment of the left lobe which measures approximately 1.7 cm which is indeterminate.  Diffuse gallbladder wall thickening is unchanged in likely secondary to cirrhosis. No evidence of biliary ductal dilatation.  Pancreas: No mass or inflammatory changes.  Spleen: Within normal limits in size and appearance.  Adrenals/Urinary Tract: No masses identified. No evidence of ureteral calculi or hydronephrosis.  Stomach/Bowel: No evidence of obstruction, focal inflammatory process or abnormal fluid collections.  Vascular/Lymphatic: No pathologically enlarged lymph nodes. No abdominal aortic aneurysm. Numerous venous collaterals are again seen within the abdomen, also consistent portal venous hypertension.  Reproductive: No pelvic masses identified.  Other: Diffuse mesenteric edema shows slight increase since previous study and new mild ascites is seen in the perihepatic space and both lower quadrants. A fluid collection is seen in the subcutaneous tissues at the site of the umbilicus which measures 4.3 x 2.2 cm and shows mild increase in size since previous study. This is suspicious for a small hernia containing fluid, however no herniated fat or bowel loops are seen.  Musculoskeletal: No suspicious bone lesions identified.  IMPRESSION: Hepatic cirrhosis and findings of portal venous hypertension.  New mild ascites and increased mesenteric edema.  No significant change in 4 cm mass in the lateral liver dome, suspicious for benign hemangioma, however a smaller, subtle low-attenuation lesion is seen in the left lobe. Hepatocellular carcinoma cannot definitely be excluded in the setting of cirrhosis. Recommend further characterization with nonemergent abdomen MRI without and with contrast as an outpatient.  Mild  increase in size of 4.3 cm subcutaneous fluid collection at the site of the umbilicus, suspicious for a fluid-containing ventral hernia. No herniated fat or bowel loops.   Electronically Signed By: Marlaine Hind M.D. On: 12/04/2020 11:25   Procedures Procedures   Medications Ordered in ED Medications  iohexol (OMNIPAQUE) 300 MG/ML solution 100 mL (100 mLs Intravenous Contrast Given 12/04/20 1016)    ED Course  I have reviewed the triage vital signs and the nursing notes.  Pertinent labs & imaging results that were available during my care of the patient were reviewed by me and considered in my medical decision making (see chart for details).    MDM Rules/Calculators/A&P                          Patient presents with epigastric pain along with intermittent nausea and vomiting. Patient is nontoxic appearing, afebrile, not tachycardic, not tachypneic, not hypotensive, maintains excellent SPO2 on room air, and is in no apparent distress.   I have reviewed the patient's chart to obtain more information.   I reviewed and interpreted the patient's labs and radiological studies. No leukocytosis.  LFTs without abnormality, improvement from previous visits. Repeat abdominal exams benign. CT with mild ascites.  Abdomen soft.  Hernia reducible and does not contain bowel.  Area of abnormality to the liver was noted and discussed with the patient including the possibility for hepatocellular carcinoma, but with the need for further evaluation.  Patient was provided with a printout of this CT read. Tolerating PO at time of discharge. The patient was given instructions for home care as well as return precautions. Patient voices understanding of these instructions, accepts the plan, and is comfortable with discharge.  Findings and plan of care discussed with attending physician, Charlesetta Shanks, MD.   Vitals:   12/04/20 1315 12/04/20 1330 12/04/20 1345 12/04/20 1400  BP: (!) 154/89 Marland Kitchen)  153/86  (!) 168/98 (!) 151/97  Pulse: 83 83 81 85  Resp:  16  18  Temp:      TempSrc:      SpO2: 98% 97% 98% 96%  Weight:      Height:         Final Clinical Impression(s) / ED Diagnoses Final diagnoses:  Epigastric pain    Rx / DC Orders ED Discharge Orders         Ordered    ondansetron (ZOFRAN ODT) 4 MG disintegrating tablet  Every 8 hours PRN        12/04/20 1405    pantoprazole (PROTONIX) 40 MG tablet  Daily        12/04/20 1405    sucralfate (CARAFATE) 1 g tablet  3 times daily with meals & bedtime        12/04/20 1405           Lorayne Bender, PA-C 12/04/20 1412    Charlesetta Shanks, MD 12/05/20 [phone]

## 2020-12-04 NOTE — ED Triage Notes (Signed)
Patient arrived with EMS from home reports central chest pain and mid abdominal pain this week with emesis , he received Zofran 4 mg IV by EMS prior to arrival , respirations unlabored , hypertensive 174/100  , CBG=221.

## 2020-12-04 NOTE — Discharge Instructions (Addendum)
°  Diet: Start with a clear liquid diet, progressed to a full liquid diet, and then bland solids as you are able. Please adhere to the enclosed dietary suggestions.  In general, avoid NSAIDs (i.e. ibuprofen, naproxen, etc.), caffeine, alcohol, spicy foods, fatty foods, or any other foods that seem to cause your symptoms to arise.  Protonix: Take this medication daily, 20-30 minutes prior to your first meal, for the next 8 weeks.  Continue to take this medication even if you begin to feel better.  Sucralfate: Sucralfate (generic for Carafate) is meant to soothe symptoms of abdominal discomfort and reflux.  Follow-up: There was evidence of a liver mass on the CT scan.  This will need follow-up by gastroenterology.  Call to make an appointment.  Return: Return to the ED for significantly worsening symptoms, persistent vomiting, persistent fever, vomiting blood, blood in the stools, dark stools, or any other major concerns.  For prescription assistance, may try using prescription discount sites or apps, such as goodrx.com

## 2020-12-05 ENCOUNTER — Telehealth: Payer: Self-pay

## 2020-12-05 NOTE — Telephone Encounter (Signed)
Transition Care Management Unsuccessful Follow-up Telephone Call  Date of discharge and from where:  12/04/2020 from [facility]  Attempts:  1st Attempt  Reason for unsuccessful TCM follow-up call:  Missing or invalid number

## 2020-12-08 NOTE — Telephone Encounter (Signed)
Transition Care Management Unsuccessful Follow-up Telephone Call  Date of discharge and from where:  12/04/2020 - [facility]   Attempts:  2nd Attempt  Reason for unsuccessful TCM follow-up call:  Missing or invalid number

## 2020-12-09 NOTE — Telephone Encounter (Signed)
Transition Care Management Unsuccessful Follow-up Telephone Call  Date of discharge and from where:  12/[facility]  Attempts:  3rd Attempt  Reason for unsuccessful TCM follow-up call:  Missing or invalid number

## 2021-07-23 IMAGING — US US ABDOMEN LIMITED
1 series · 14 of 25 positions shown · non-contrast
Comparison: 06/01/2019

CLINICAL DATA: Right upper quadrant pain for 2 days

EXAM:
ULTRASOUND ABDOMEN LIMITED RIGHT UPPER QUADRANT

[Series 1: us abdomen limited · 49 acquisitions, 14 frames shown]
[im 1/49]
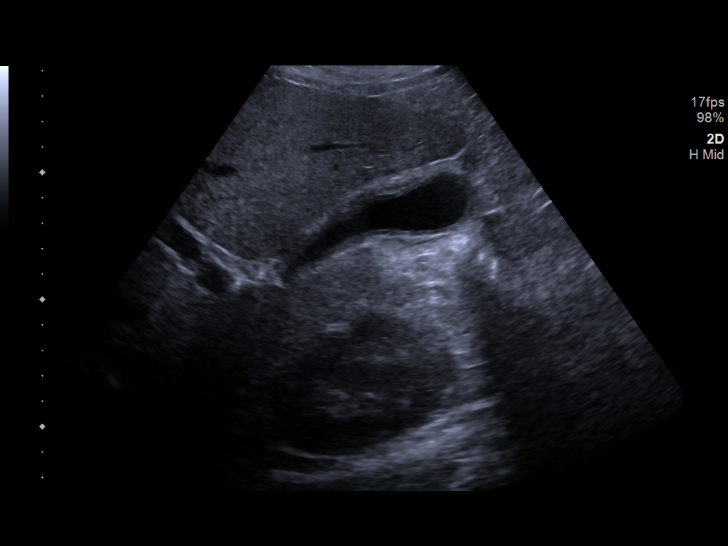
[im 5/49]
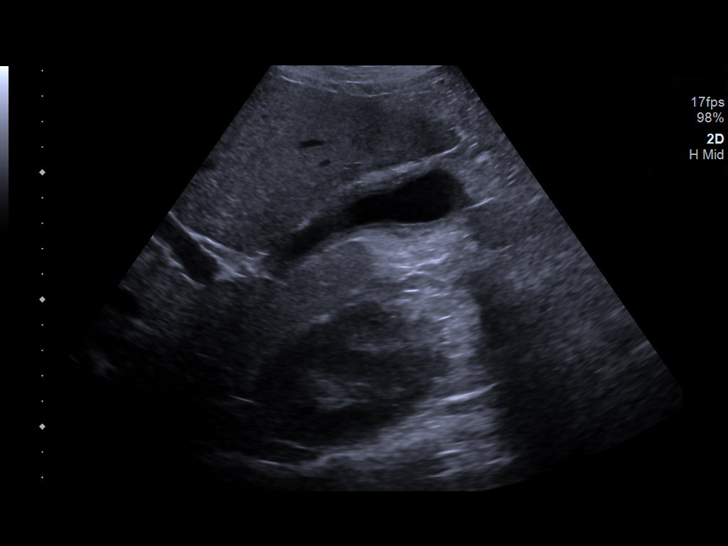
[im 9/49]
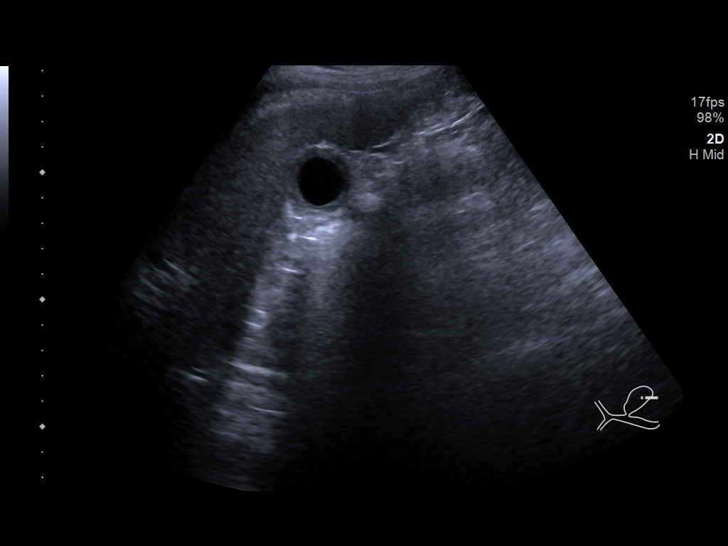
[im 13/49]
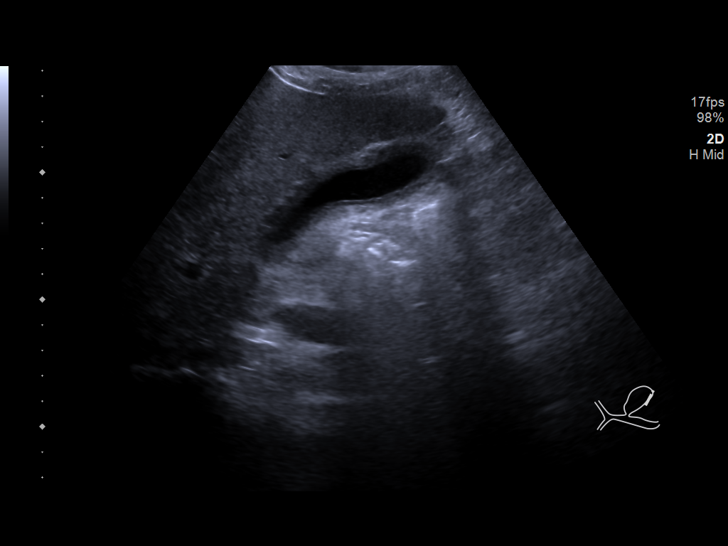
[im 17/49]
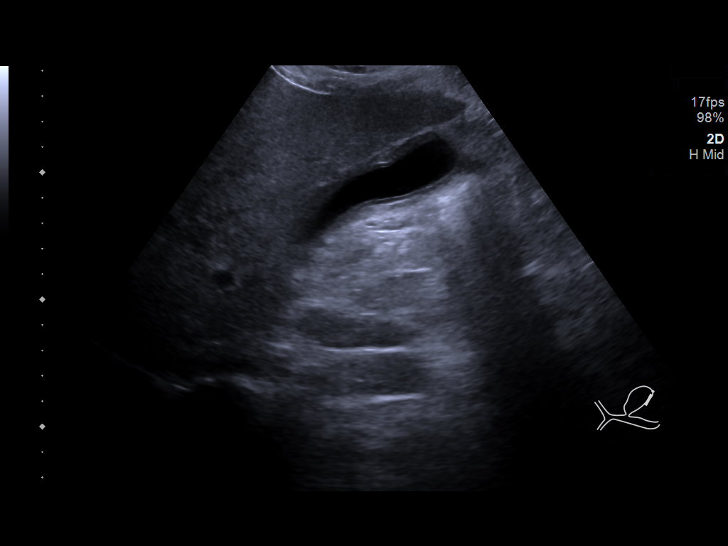
[im 19/49]
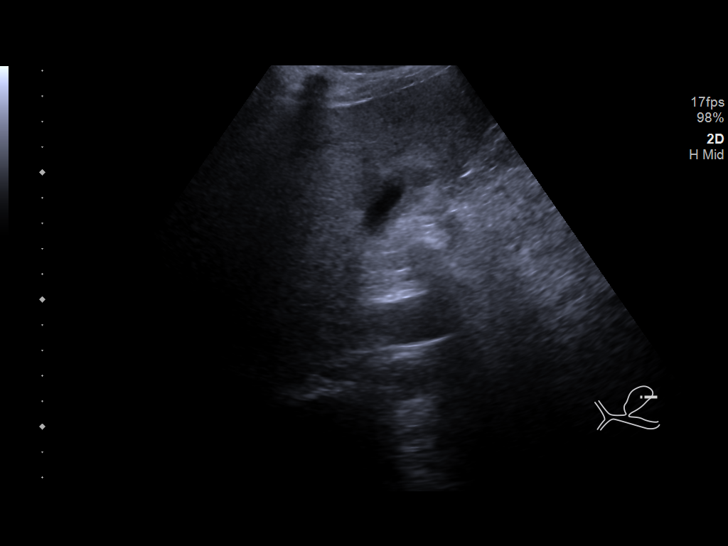
[im 23/49]
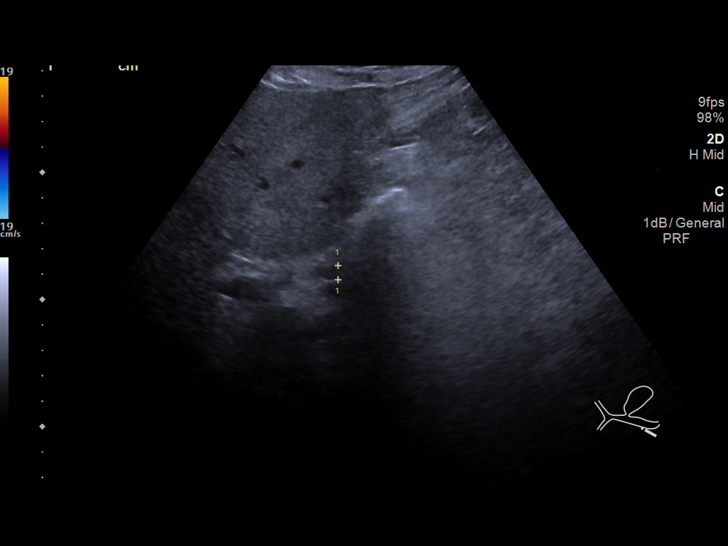
[im 27/49]
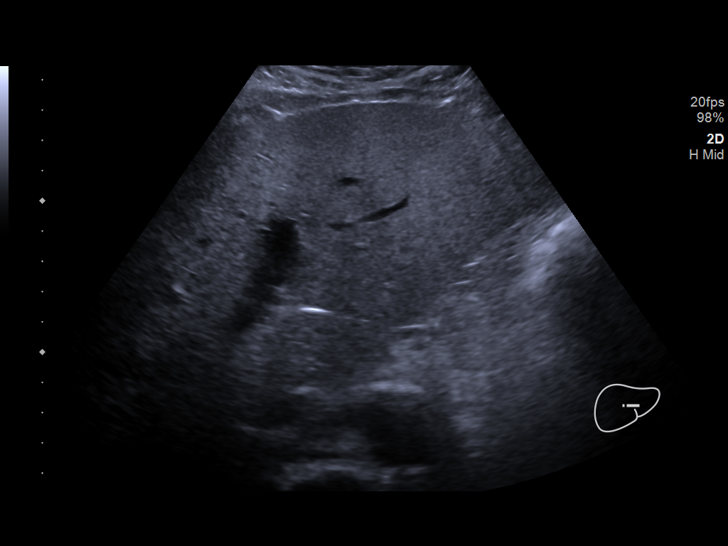
[im 31/49]
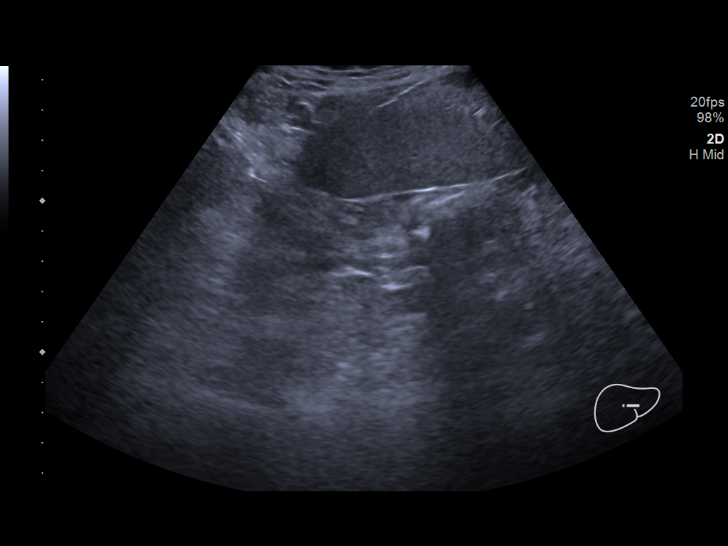
[im 33/49]
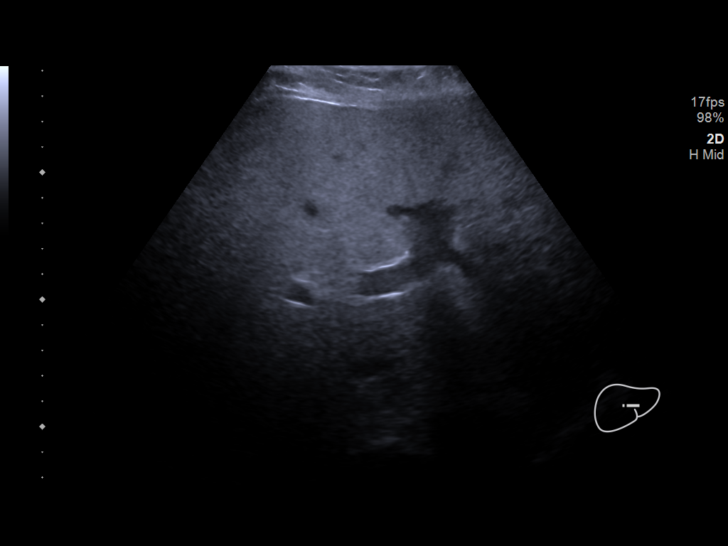
[im 37/49]
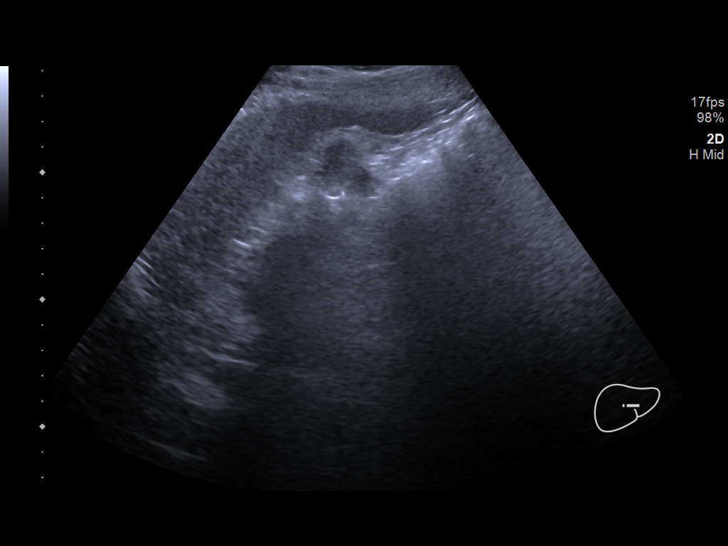
[im 41/49]
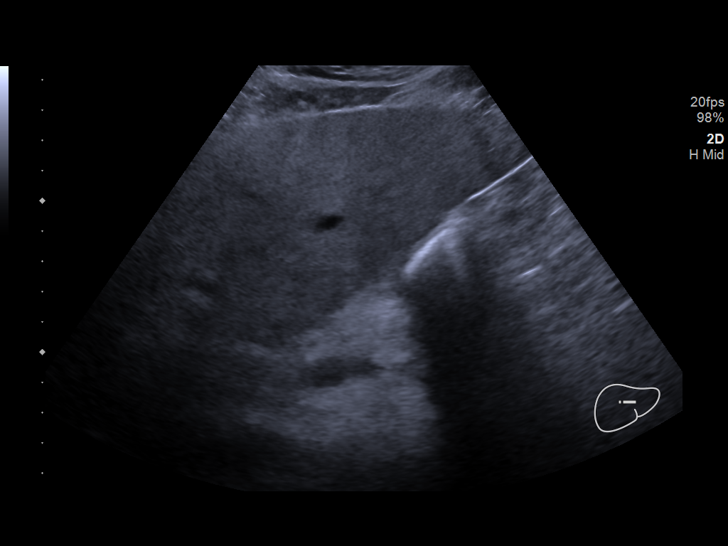
[im 45/49]
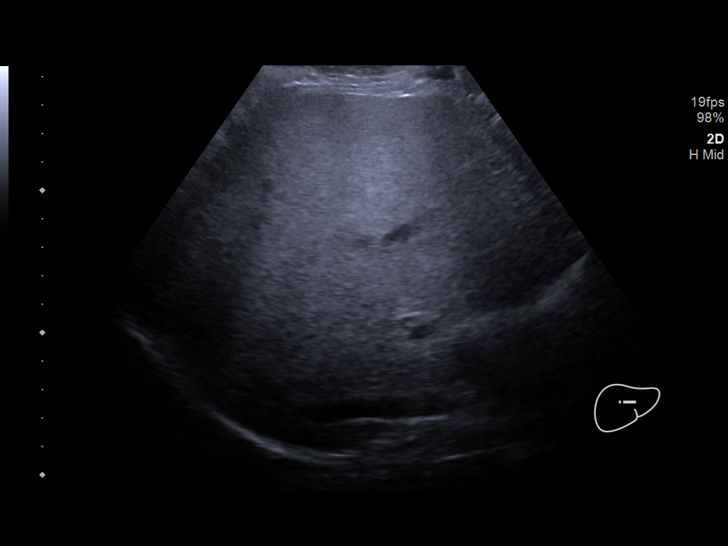
[im 49/49]
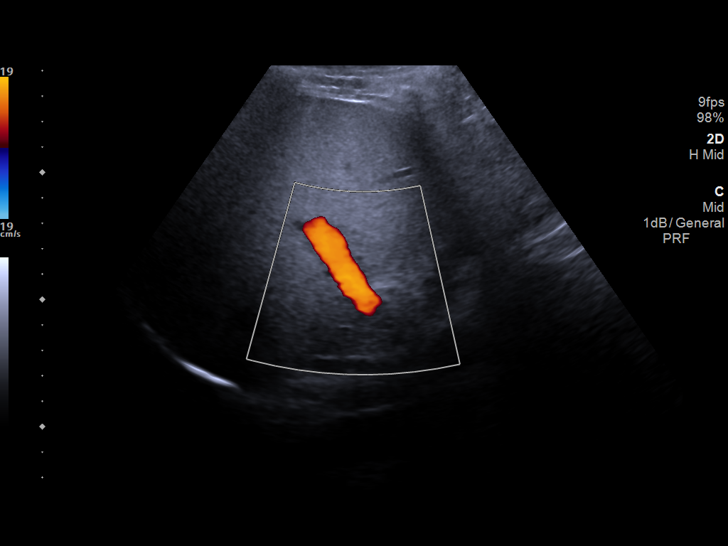

[14 of 25 positions shown; findings below may reference images not displayed]

FINDINGS: Gallbladder:

No cholelithiasis or pericholecystic fluid. Gallbladder wall
thickening more focally thickened measuring up to 4 mm. Positive
sonographic Murphy sign.

Common bile duct:

Diameter: 6 mm

Liver:

No focal lesion identified. Increased hepatic parenchymal
echogenicity. Portal vein is patent on color Doppler imaging with
normal direction of blood flow towards the liver.

Other: None.
IMPRESSION: 1. No cholelithiasis. Mild relative thickening of the gallbladder
wall measuring up to 4 mm with a reported positive sonographic
Murphy sign. This may reflect an under distended gallbladder versus
acalculus cholecystitis versus reactive changes as can be seen in
hepatocellular disease.
2. Increased hepatic echogenicity as can be seen with hepatic
steatosis.

## 2021-08-04 ENCOUNTER — Other Ambulatory Visit: Payer: Self-pay | Admitting: Family

## 2021-08-04 DIAGNOSIS — R160 Hepatomegaly, not elsewhere classified: Secondary | ICD-10-CM

## 2021-10-27 ENCOUNTER — Emergency Department (HOSPITAL_COMMUNITY)
Admission: EM | Admit: 2021-10-27 | Discharge: 2021-10-27 | Disposition: A | Discharge status: Home / Self Care | Payer: Medicare Other | Attending: Emergency Medicine | Admitting: Emergency Medicine

## 2021-10-27 ENCOUNTER — Other Ambulatory Visit: Payer: Self-pay

## 2021-10-27 ENCOUNTER — Emergency Department (HOSPITAL_COMMUNITY): Payer: Medicare Other

## 2021-10-27 ENCOUNTER — Encounter (HOSPITAL_COMMUNITY): Payer: Self-pay

## 2021-10-27 DIAGNOSIS — F1011 Alcohol abuse, in remission: Secondary | ICD-10-CM | POA: Diagnosis not present

## 2021-10-27 DIAGNOSIS — K7460 Unspecified cirrhosis of liver: Secondary | ICD-10-CM | POA: Insufficient documentation

## 2021-10-27 DIAGNOSIS — Z7984 Long term (current) use of oral hypoglycemic drugs: Secondary | ICD-10-CM | POA: Diagnosis not present

## 2021-10-27 DIAGNOSIS — Z85818 Personal history of malignant neoplasm of other sites of lip, oral cavity, and pharynx: Secondary | ICD-10-CM | POA: Insufficient documentation

## 2021-10-27 DIAGNOSIS — R221 Localized swelling, mass and lump, neck: Secondary | ICD-10-CM | POA: Insufficient documentation

## 2021-10-27 DIAGNOSIS — R1013 Epigastric pain: Secondary | ICD-10-CM | POA: Insufficient documentation

## 2021-10-27 DIAGNOSIS — R222 Localized swelling, mass and lump, trunk: Secondary | ICD-10-CM | POA: Diagnosis not present

## 2021-10-27 DIAGNOSIS — R112 Nausea with vomiting, unspecified: Secondary | ICD-10-CM | POA: Insufficient documentation

## 2021-10-27 DIAGNOSIS — R188 Other ascites: Secondary | ICD-10-CM

## 2021-10-27 LAB — CBC WITH DIFFERENTIAL/PLATELET
Abs Immature Granulocytes: 0.04 10*3/uL (ref 0.00–0.07)
Basophils Absolute: 0 10*3/uL (ref 0.0–0.1)
Basophils Relative: 1 %
Eosinophils Absolute: 0 10*3/uL (ref 0.0–0.5)
Eosinophils Relative: 0 %
HCT: 44.8 % (ref 39.0–52.0)
Hemoglobin: 15.4 g/dL (ref 13.0–17.0)
Immature Granulocytes: 1 %
Lymphocytes Relative: 13 %
Lymphs Abs: 0.9 10*3/uL (ref 0.7–4.0)
MCH: 30.4 pg (ref 26.0–34.0)
MCHC: 34.4 g/dL (ref 30.0–36.0)
MCV: 88.5 fL (ref 80.0–100.0)
Monocytes Absolute: 0.5 10*3/uL (ref 0.1–1.0)
Monocytes Relative: 7 %
Neutro Abs: 5.6 10*3/uL (ref 1.7–7.7)
Neutrophils Relative %: 78 %
Platelets: 148 10*3/uL — ABNORMAL LOW (ref 150–400)
RBC: 5.06 MIL/uL (ref 4.22–5.81)
RDW: 11.7 % (ref 11.5–15.5)
WBC: 7.1 10*3/uL (ref 4.0–10.5)
nRBC: 0 % (ref 0.0–0.2)

## 2021-10-27 LAB — ETHANOL: Alcohol, Ethyl (B): <10 mg/dL (ref <10)

## 2021-10-27 LAB — COMPREHENSIVE METABOLIC PANEL
ALT: 58 U/L — ABNORMAL HIGH (ref 0–44)
AST: 67 U/L — ABNORMAL HIGH (ref 15–41)
Albumin: 4.1 g/dL (ref 3.5–5.0)
Alkaline Phosphatase: 127 U/L — ABNORMAL HIGH (ref 38–126)
Anion gap: 10 (ref 5–15)
BUN: 17 mg/dL (ref 6–20)
CO2: 22 mmol/L (ref 22–32)
Calcium: 9.1 mg/dL (ref 8.9–10.3)
Chloride: 97 mmol/L — ABNORMAL LOW (ref 98–111)
Creatinine, Ser: 0.95 mg/dL (ref 0.61–1.24)
GFR, Estimated: >60 mL/min (ref >60)
Glucose, Bld: 229 mg/dL — ABNORMAL HIGH (ref 70–99)
Potassium: 4.1 mmol/L (ref 3.5–5.1)
Sodium: 129 mmol/L — ABNORMAL LOW (ref 135–145)
Total Bilirubin: 1.1 mg/dL (ref 0.3–1.2)
Total Protein: 8.1 g/dL (ref 6.5–8.1)

## 2021-10-27 LAB — LIPASE, BLOOD: Lipase: 31 U/L (ref 11–51)

## 2021-10-27 MED ORDER — ONDANSETRON HCL 4 MG/2ML IJ SOLN
4.0000 mg | Freq: Once | INTRAMUSCULAR | Status: AC
  Administered 2021-10-27: 4 mg via INTRAVENOUS
  Filled 2021-10-27: qty 2

## 2021-10-27 MED ORDER — ONDANSETRON 4 MG PO TBDP: ORAL_TABLET | ORAL | 0 refills | Status: DC

## 2021-10-27 MED ORDER — IOHEXOL 300 MG/ML  SOLN
100.0000 mL | Freq: Once | INTRAMUSCULAR | Status: AC | PRN
  Administered 2021-10-27: 100 mL via INTRAVENOUS

## 2021-10-27 MED ORDER — SODIUM CHLORIDE 0.9 % IV BOLUS
1000.0000 mL | Freq: Once | INTRAVENOUS | Status: AC
  Administered 2021-10-27: 1000 mL via INTRAVENOUS

## 2021-10-27 NOTE — Discharge Instructions (Addendum)
You most likely have persistent swelling after your parotid surgery.  However, since you have more swelling, you should see ENT again for follow-up.  You may need a PET scan or MRI to further assess  Take Zofran for nausea and stay hydrated  You have cirrhosis.  Please avoid drinking alcohol.  He should follow-up with GI doctor  See your doctor for follow-up.  Your sodium is slightly low so please recheck with your doctor next week  Return to ER if you have worse neck swelling, vomiting, abdominal pain

## 2021-10-27 NOTE — ED Provider Notes (Signed)
[facility] DEPT Provider Note   CSN: [id number] Arrival date & time: 10/27/21  5809     History  Chief Complaint  Patient presents with   Emesis    Jimmy Chandler is a 51 y.o. male history of alcohol abuse, parotid cancer status post radical palatectomy, here presenting with vomiting and abdominal pain.  Patient told me that he has nausea vomiting since yesterday.  Also low-grade temperature at home.  Patient states that he noticed some swelling on the right side of the neck and chest.  Patient has known cirrhosis.  Patient also has abdominal pain as well.  The history is provided by the patient.      Home Medications Prior to Admission medications   Medication Sig Start Date End Date Taking? Authorizing Provider  atorvastatin (LIPITOR) 80 MG tablet Take 80 mg by mouth daily.  05/08/19   [provider]  fluticasone (FLONASE) 50 MCG/ACT nasal spray Place 2 sprays into both nostrils daily. Patient taking differently: Place 2 sprays into both nostrils daily as needed for allergies. 10/21/18   Fawze, Mina A, PA-C  furosemide (LASIX) 20 MG tablet Take 1 tablet (20 mg total) by mouth daily. Patient taking differently: Take 20 mg by mouth as needed for fluid.  09/27/19   Wardell Honour, MD  gabapentin (NEURONTIN) 300 MG capsule Take 300 mg by mouth 2 (two) times daily.    [provider]  glipiZIDE (GLUCOTROL) 10 MG tablet Take 10 mg by mouth daily before breakfast.    [provider]  metFORMIN (GLUCOPHAGE) 500 MG tablet Take 1 tablet (500 mg total) by mouth 2 (two) times daily with a meal for 30 days. Patient taking differently: Take 500 mg by mouth daily with supper. 03/10/19 05/16/20  Lorin Glass, PA-C  mirtazapine (REMERON) 15 MG tablet Take 15 mg by mouth at bedtime. 04/09/20   [provider]  ondansetron (ZOFRAN ODT) 4 MG disintegrating tablet Take 1 tablet (4 mg total) by mouth every 8 (eight) hours as needed  for nausea or vomiting. 12/04/20   Joy, Shawn C, PA-C  pantoprazole (PROTONIX) 40 MG tablet Take 1 tablet (40 mg total) by mouth daily. 12/04/20 01/03/21  Joy, Shawn C, PA-C  sucralfate (CARAFATE) 1 g tablet Take 1 tablet (1 g total) by mouth 4 (four) times daily -  with meals and at bedtime for 15 days. 12/04/20 12/19/20  Joy, Helane Gunther, PA-C      Allergies    Patient has no known allergies.    Review of Systems   Review of Systems  HENT:         Right neck swelling  Gastrointestinal:  Positive for abdominal pain and vomiting.  All other systems reviewed and are negative.  Physical Exam Updated Vital Signs BP (!) 173/106 (BP Location: Right Arm)    Pulse 84    Temp 99.1 F (37.3 C) (Oral)    Resp 17    Ht 5\' 6"  (1.676 m)    Wt 93 kg    SpO2 98%    BMI 33.09 kg/m  Physical Exam Vitals and nursing note reviewed.  Constitutional:      Appearance: Normal appearance.  HENT:     Head: Normocephalic.  Eyes:     Extraocular Movements: Extraocular movements intact.     Pupils: Pupils are equal, round, and reactive to light.  Neck:     Comments: Patient has previous radical peridectomy scar.  Patient has some swelling in  the right side of the neck into the right clavicle area Cardiovascular:     Rate and Rhythm: Normal rate and regular rhythm.     Pulses: Normal pulses.     Heart sounds: Normal heart sounds.  Pulmonary:     Effort: Pulmonary effort is normal.     Breath sounds: Normal breath sounds.  Abdominal:     General: Abdomen is flat.     Palpations: Abdomen is soft.     Comments: Epigastric tenderness.  Patient has a umbilical hernia that is reducible.  Musculoskeletal:        General: Normal range of motion.  Skin:    General: Skin is warm.     Capillary Refill: Capillary refill takes less than 2 seconds.  Neurological:     General: No focal deficit present.     Mental Status: He is alert and oriented to person, place, and time.  Psychiatric:        Mood and Affect: Mood  normal.        Behavior: Behavior normal.    ED Results / Procedures / Treatments   Labs (all labs ordered are listed, but only abnormal results are displayed) Labs Reviewed  CBC WITH DIFFERENTIAL/PLATELET - Abnormal; Notable for the following components:      Result Value   Platelets 148 (*)    All other components within normal limits  COMPREHENSIVE METABOLIC PANEL - Abnormal; Notable for the following components:   Sodium 129 (*)    Chloride 97 (*)    Glucose, Bld 229 (*)    AST 67 (*)    ALT 58 (*)    Alkaline Phosphatase 127 (*)    All other components within normal limits  RESP PANEL BY RT-PCR (FLU A&B, COVID) ARPGX2  ETHANOL  LIPASE, BLOOD    EKG None  Radiology No results found.  Procedures Procedures    Medications Ordered in ED Medications  sodium chloride 0.9 % bolus 1,000 mL (1,000 mLs Intravenous New Bag/Given 10/27/21 1947)  ondansetron (ZOFRAN) injection 4 mg (4 mg Intravenous Given 10/27/21 1947)    ED Course/ Medical Decision Making/ A&P                           Medical Decision Making Jimmy Chandler is a 51 y.o. male here presenting with chills and nausea and vomiting and right-sided neck swelling.  Patient has a history of parotid cancer status post peridectomy.  Concern for possible recurrent cancer.  Patient also has history of cirrhosis so I am concerned for possible worsening cirrhosis versus pancreatitis.  Consider COVID or pneumonia as well. At this point, we will get CBC and CMP and lipase and COVID test.  We will also get CT neck and chest abdomen pelvis.  Will hydrate patient and give Zofran and reassess  9:28 PM CBC and CMP and ETOH reviewed. Showed sodium of 129, AST, ALT mildly elevated.  This is consistent with alcohol use versus mild dehydration.  CT abdomen showed cirrhosis.  CT neck showed possible seroma.  It may be possible that he has recurrent disease but there is no prior imaging to compare to.  Patient also has possible C6  hemangioma versus metastatic disease.  At this point, patient should follow-up with his ENT doctor at [facility].  Patient able to tolerate p.o. after Zofran.  I think he likely has alcoholic gastritis versus gastroenteritis.  Will discharge patient home with Zofran  Problems Addressed:  Nausea and vomiting, unspecified vomiting type: acute illness or injury Neck mass: chronic illness or injury  Amount and/or Complexity of Data Reviewed External Data Reviewed: radiology and notes. Labs: ordered. Decision-making details documented in ED Course. Radiology: ordered and independent interpretation performed. Decision-making details documented in ED Course.  Risk Prescription drug management.   Final Clinical Impression(s) / ED Diagnoses Final diagnoses:  None    Rx / DC Orders ED Discharge Orders     None         Drenda Freeze, MD 10/27/21 2132

## 2021-10-27 NOTE — ED Triage Notes (Signed)
Pt BIB EMS, pt reports with nausea and vomiting since last night. Pt states that he has a hx of head cancer.

## 2021-11-24 ENCOUNTER — Encounter (HOSPITAL_COMMUNITY): Payer: Self-pay | Admitting: Radiology

## 2022-04-01 ENCOUNTER — Other Ambulatory Visit (HOSPITAL_COMMUNITY): Payer: Self-pay | Admitting: Family

## 2022-04-01 DIAGNOSIS — R160 Hepatomegaly, not elsewhere classified: Secondary | ICD-10-CM

## 2022-04-07 ENCOUNTER — Ambulatory Visit (HOSPITAL_COMMUNITY)
Admission: RE | Admit: 2022-04-07 | Discharge: 2022-04-07 | Disposition: A | Discharge status: Home / Self Care | Payer: Medicare Other | Source: Ambulatory Visit | Attending: Family | Admitting: Family

## 2022-04-07 DIAGNOSIS — R160 Hepatomegaly, not elsewhere classified: Secondary | ICD-10-CM | POA: Insufficient documentation

## 2022-04-07 MED ORDER — GADOBUTROL 1 MMOL/ML IV SOLN
10.0000 mL | Freq: Once | INTRAVENOUS | Status: AC | PRN
  Administered 2022-04-07: 10 mL via INTRAVENOUS

## 2022-04-19 ENCOUNTER — Ambulatory Visit: Payer: Self-pay | Admitting: Surgery

## 2022-04-19 NOTE — H&P (Signed)
Subjective   Chief Complaint: New Patient (Ventral hernia )       History of Present Illness: Jimmy Chandler is a 51 y.o. male who is seen today as an office consultation at the request of Dr. Tamala Julian for evaluation of New Patient (Ventral hernia ) .     This is a 51 year old male with recent evidence of cirrhosis on CT scan as well as MRI who presents with an umbilical hernia.  This has been present for at least 3 months and seems to be enlarging.  The patient has a history of heavy drinking.  Currently he drinks about 3 beers a day.  He does smoke cigars.  He has not had any previous abdominal surgery.  He states that his abdomen has become more distended over the last couple of years.  The umbilicus began to protrude a few months ago.  It has become larger and causes some discomfort.  He denies any GI obstructive symptoms.   The patient had labs earlier in the year that showed mildly elevated AST and ALT but normal bilirubin and normal creatinine.  His MELD score is 7.   He has a history of what appears to be parotid cancer status post parotidectomy.  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  He is disabled. Review of Systems: A complete review of systems was obtained from the patient.  I have reviewed this information and discussed as appropriate with the patient.  See HPI as well for other ROS.   Review of Systems  Constitutional:  Positive for weight loss.  HENT:  Positive for hearing loss.   Eyes:  Positive for pain and redness.  Respiratory: Negative.    Cardiovascular:  Positive for chest pain.  Gastrointestinal: Negative.   Genitourinary: Negative.   Musculoskeletal:  Positive for neck pain.  Skin: Negative.   Neurological: Negative.   Endo/Heme/Allergies: Negative.   Psychiatric/Behavioral:  Positive for depression. The patient is nervous/anxious.        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 No pertinent surgical history.     Allergies No Known Allergies    Current Outpatient Medications on File Prior to Visit Medication Sig Dispense Refill  tiZANidine (ZANAFLEX) 2 MG tablet TAKE ONE TABLET BY MOUTH EVERY 8 HOURS AS NEEDED for muscle spasm for 7 days      TRULICITY 1.93 XT/0.2 mL subcutaneous pen injector Take 1 injection SUBCUTANEOUSLY once a week for diabetes for 30 days      amLODIPine-benazepril (LOTREL) 5-40 mg capsule as directed      escitalopram oxalate (LEXAPRO) 20 MG tablet TAKE 1 Tablet BY MOUTH ONCE DAILY AT BEDTIME FOR anxiety AND depression FOR 90 DAYS      ferrous sulfate 325 (65 FE) MG EC tablet 1 tablet      fluticasone propionate (FLONASE ALLERGY RELIEF) 50 mcg/actuation nasal spray 1 spray in each nostril      naltrexone (REVIA) 50 mg tablet TAKE 1 Tablet BY MOUTH ONCE DAILY FOR alcohol dependence  pantoprazole (PROTONIX) 40 MG DR tablet TAKE 1 Tablet BY MOUTH ONCE DAILY FOR 90 DAYS FOR indigestion       No current facility-administered medications on file prior to visit.     Family History Family History Problem Relation Age of Onset  Diabetes Mother    Hyperlipidemia (Elevated cholesterol) Father    High blood pressure (Hypertension) Father    Breast cancer Brother        Social History   Tobacco Use Smoking Status Every Day  Types: Cigarettes Smokeless Tobacco Current     Social History Social History    Socioeconomic History  Marital status: Single Tobacco Use  Smoking status: Every Day     Types: Cigarettes  Smokeless  tobacco: Current Substance and Sexual Activity  Alcohol use: Yes  Drug use: Never      Objective:     Vitals:   04/19/22 1004 BP: 130/78 Pulse: 100 Temp: 36.8 C (98.2 F) SpO2: 98% Weight: 87.9 kg (193 lb 12.8 oz) Height: 172.7 cm ('5\' 8"'$ )   Body mass index is 29.47 kg/m.   Physical Exam    Constitutional:  WDWN in NAD, conversant, no obvious deformities; lying in bed comfortably Eyes:  Pupils equal, round; sclera anicteric; moist conjunctiva; no lid lag HENT:  Oral mucosa moist; good dentition  Neck:  No masses palpated, trachea midline; no thyromegaly Lungs:  CTA bilaterally; normal respiratory effort CV:  Regular rate and rhythm; no murmurs; extremities well-perfused with no edema Abd:  +bowel sounds, soft, mildly distended no palpable organomegaly; protruding umbilical hernia that is reducible.  The hernia defect is approximately 2 cm across. Musc:  Unable to assess gait; no apparent clubbing or cyanosis in extremities Lymphatic:  No palpable cervical or axillary lymphadenopathy Skin:  Warm, dry; no sign of jaundice Psychiatric - alert and oriented x 4; calm mood and affect     Labs, Imaging and Diagnostic Testing:     CLINICAL DATA:  Cirrhosis.  Right hepatic lobe mass on recent CT.   EXAM: MRI ABDOMEN WITHOUT AND WITH CONTRAST   TECHNIQUE: Multiplanar multisequence MR imaging of the abdomen was performed both before and after the administration of intravenous contrast.   CONTRAST:  44m GADAVIST GADOBUTROL 1 MMOL/ML IV SOLN   COMPARISON:  CT on 10/27/2021   FINDINGS: Lower chest: No acute findings.   Hepatobiliary: Hepatic cirrhosis again demonstrated. A benign hemangioma is seen in the lateral dome of the right hepatic lobe which measures 3.4 x 2.7 cm. A smaller benign hemangioma is seen in the lateral segment of the left hepatic lobe measuring 2.1 x 1.6 cm. A 1.3 cm benign hemangioma is also seen which is capsular in location in the lateral segment  of the left hepatic lobe. Gallbladder is unremarkable. No evidence of biliary ductal dilatation.   Pancreas:  No mass or inflammatory changes.   Spleen:  Within normal limits in size and appearance.   Adrenals/Urinary Tract: No masses identified. No evidence of hydronephrosis.   Stomach/Bowel: Unremarkable.   Vascular/Lymphatic: No pathologically enlarged lymph nodes identified. No acute vascular findings.   Other:  Small umbilical hernia is seen, which contains only fat.   Musculoskeletal:  No suspicious bone lesions identified.   IMPRESSION: Hepatic cirrhosis. Several small benign hemangiomas, largest measuring 3.4 cm. No evidence of hepatocellular carcinoma.   Small umbilical hernia, which contains only fat.     Electronically Signed   By: JMarlaine HindM.D.   On: 04/08/2022 10:13     CLINICAL  DATA:  Weight loss, unintended.  Nausea, vomiting   EXAM: CT CHEST, ABDOMEN, AND PELVIS WITH CONTRAST   TECHNIQUE: Multidetector CT imaging of the chest, abdomen and pelvis was performed following the standard protocol during bolus administration of intravenous contrast.   RADIATION DOSE REDUCTION: This exam was performed according to the departmental dose-optimization program which includes automated exposure control, adjustment of the mA and/or kV according to patient size and/or use of iterative reconstruction technique.   CONTRAST:  156m OMNIPAQUE IOHEXOL 300 MG/ML  SOLN   COMPARISON:  None.   FINDINGS: CT CHEST FINDINGS   Cardiovascular: Heart is normal size. Aorta is normal caliber.   Mediastinum/Nodes: No mediastinal, hilar, or axillary adenopathy. Trachea and esophagus are unremarkable. Thyroid unremarkable.   Lungs/Pleura: Lungs are clear. No focal airspace opacities or suspicious nodules. No effusions.   Musculoskeletal: Chest wall soft tissues are unremarkable. No acute bony abnormality. No focal bone lesions.   CT ABDOMEN PELVIS FINDINGS    Hepatobiliary: 2.7 cm low-density lesion in the right hepatic lobe with peripheral contrast puddling most compatible with hemangioma. No suspicious focal hepatic abnormality. Subtle nodularity to the liver contours suggest cirrhosis. This is unchanged. Gallbladder unremarkable.   Pancreas: No focal abnormality or ductal dilatation.   Spleen: No focal abnormality.  Normal size.   Adrenals/Urinary Tract: No adrenal abnormality. No focal renal abnormality. No stones or hydronephrosis. Urinary bladder is unremarkable.   Stomach/Bowel: Stomach, large and small bowel grossly unremarkable.   Vascular/Lymphatic: No evidence of aneurysm or adenopathy.   Reproductive: No visible focal abnormality.   Other: No free fluid or free air.   Musculoskeletal: No acute bony abnormality.  No focal bone lesion.   IMPRESSION: No acute findings in the chest, abdomen or pelvis. No explanation for the patient's unintended weight loss.   Changes of cirrhosis. Lesion within the right hepatic lobe demonstrates peripheral puddling of contrast most compatible with hemangioma, stable since prior study.     Electronically Signed   By: KRolm BaptiseM.D.   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  I highly recommended complete cessation of alcohol intake as well as cessation of smoking.  His overall hepatic function is borderline with a very low MELD score.  I would recommend repairing this umbilical hernia before his hepatic function deteriorates.  He seems to have relatively small amounts of ascites on his scans and physical examination.   We will plan umbilical hernia repair, likely with mesh.  We will perform this at one of the hospitals due to  his slightly elevated risk of perioperative complications.The surgical procedure has been discussed with the patient.  Potential risks, benefits, alternative treatments, and expected outcomes have been explained.  All of the patient's questions at this time have been answered.  The likelihood of reaching the patient's treatment goal is good.  The patient understand the proposed surgical procedure and wishes to proceed.     No follow-ups on file.   MCarlean Jews MD  04/19/2022 12:44 PM

## 2022-05-03 NOTE — Progress Notes (Signed)
For Short Stay: [facility] appointment date: Date of COVID positive in last 57 days:  Bowel Prep reminder:   For Anesthesia: PCP - Marita [place]: FNP. Cardiologist -   Chest x-ray - CT Chest: 10/27/21 EKG -  Stress Test -  ECHO -  Cardiac Cath -  Pacemaker/ICD device last checked: Pacemaker orders received: Device Rep notified:  Spinal Cord Stimulator:  Sleep Study -  CPAP -   Fasting Blood Sugar - 290 Checks Blood Sugar ___2__ times a day Date and result of last Hgb A1c-   Blood Thinner Instructions: Aspirin Instructions: Last Dose:  Activity level: Can go up a flight of stairs and activities of daily living without stopping and without chest pain and/or shortness of breath   Able to exercise without chest pain and/or shortness of breath   Unable to go up a flight of stairs without chest pain and/or shortness of breath     Anesthesia review: Hx: Smoker,DIA.HTN  Patient denies shortness of breath, fever, cough and chest pain at PAT appointment   Patient verbalized understanding of instructions that were given to them at the PAT appointment. Patient was also instructed that they will need to review over the PAT instructions again at home before surgery.

## 2022-05-03 NOTE — Patient Instructions (Signed)
DUE TO COVID-19 ONLY TWO VISITORS  (aged 50 and older)  ARE ALLOWED TO COME WITH YOU AND STAY IN THE WAITING ROOM ONLY DURING PRE OP AND PROCEDURE.   **NO VISITORS ARE ALLOWED IN THE SHORT STAY AREA OR RECOVERY ROOM!!**  IF YOU WILL BE ADMITTED INTO THE HOSPITAL YOU ARE ALLOWED ONLY FOUR SUPPORT PEOPLE DURING VISITATION HOURS ONLY (7 AM -8PM)   The support person(s) must pass our screening, gel in and out, and wear a mask at all times, including in the patient's room. Patients must also wear a mask when staff or their support person are in the room. Visitors GUEST BADGE MUST BE WORN VISIBLY  One adult visitor may remain with you overnight and MUST be in the room by 8 P.M.     Your procedure is scheduled on: 05/11/22   Report to [facility] Main Entrance    Report to admitting at: 7:30 AM   Call this number if you have problems the morning of surgery [phone]   Do not eat food :After Midnight.   After Midnight you may have the following liquids until : 6:30 AM DAY OF SURGERY  Water Black Coffee (sugar ok, NO MILK/CREAM OR CREAMERS)  Tea (sugar ok, NO MILK/CREAM OR CREAMERS) regular and decaf                             Plain Jell-O (NO RED)                                           Fruit ices (not with fruit pulp, NO RED)                                     Popsicles (NO RED)                                                                  Juice: apple, WHITE grape, WHITE cranberry Sports drinks like Gatorade (NO RED)             FOLLOW BOWEL PREP AND ANY ADDITIONAL PRE OP INSTRUCTIONS YOU RECEIVED FROM YOUR SURGEON'S OFFICE!!!   Oral Hygiene is also important to reduce your risk of infection.                                    Remember - BRUSH YOUR TEETH THE MORNING OF SURGERY WITH YOUR REGULAR TOOTHPASTE   Do NOT smoke after Midnight   Take these medicines the morning of surgery with A SIP OF WATER: gabapentin.Flonase as usual. How to Manage Your Diabetes Before  and After Surgery  Why is it important to control my blood sugar before and after surgery? Improving blood sugar levels before and after surgery helps healing and can limit problems. A way of improving blood sugar control is eating a healthy diet by:  Eating less sugar and carbohydrates  Increasing activity/exercise  Talking with your doctor about reaching your blood  sugar goals High blood sugars (greater than 180 mg/dL) can raise your risk of infections and slow your recovery, so you will need to focus on controlling your diabetes during the weeks before surgery. Make sure that the doctor who takes care of your diabetes knows about your planned surgery including the date and location.  How do I manage my blood sugar before surgery? Check your blood sugar at least 4 times a day, starting 2 days before surgery, to make sure that the level is not too high or low. Check your blood sugar the morning of your surgery when you wake up and every 2 hours until you get to the Short Stay unit. If your blood sugar is less than 70 mg/dL, you will need to treat for low blood sugar: Do not take insulin. Treat a low blood sugar (less than 70 mg/dL) with  cup of clear juice (cranberry or apple), 4 glucose tablets, OR glucose gel. Recheck blood sugar in 15 minutes after treatment (to make sure it is greater than 70 mg/dL). If your blood sugar is not greater than 70 mg/dL on recheck, call [phone] for further instructions. Report your blood sugar to the short stay nurse when you get to Short Stay.  If you are admitted to the hospital after surgery: Your blood sugar will be checked by the staff and you will probably be given insulin after surgery (instead of oral diabetes medicines) to make sure you have good blood sugar levels. The goal for blood sugar control after surgery is 80-180 mg/dL.   WHAT DO I DO ABOUT MY DIABETES MEDICATION?  Do not take oral diabetes medicines (pills) the morning of  surgery.  THE DAY BEFORE SURGERY, take glipizide as usual.      THE MORNING OF SURGERY, DO NOT TAKE ANY ORAL DIABETIC MEDICATIONS DAY OF YOUR SURGERY  Bring CPAP mask and tubing day of surgery.                              You may not have any metal on your body including hair pins, jewelry, and body piercing             Do not wear lotions, powders, perfumes/cologne, or deodorant              Men may shave face and neck.   Do not bring valuables to the hospital. [place].   Contacts, dentures or bridgework may not be worn into surgery.   Bring small overnight bag day of surgery.   DO NOT [place]. PHARMACY WILL DISPENSE MEDICATIONS LISTED ON YOUR MEDICATION LIST TO YOU DURING YOUR ADMISSION [place]!    Patients discharged on the day of surgery will not be allowed to drive home.  Someone NEEDS to stay with you for the first 24 hours after anesthesia.   Special Instructions: Bring a copy of your healthcare power of attorney and living will documents         the day of surgery if you haven't scanned them before.              Please read over the following fact sheets you were given: IF [place] - Preparing for Surgery Before  surgery, you can play an important role.  Because skin is not sterile, your skin needs to be as free of germs as possible.  You can reduce the number of germs on your skin by washing with CHG (chlorahexidine gluconate) soap before surgery.  CHG is an antiseptic cleaner which kills germs and bonds with the skin to continue killing germs even after washing. Please DO NOT use if you have an allergy to CHG or antibacterial soaps.  If your skin becomes reddened/irritated stop using the CHG and inform your nurse when you arrive at Short Stay. Do not shave (including legs and underarms) for at least  48 hours prior to the first CHG shower.  You may shave your face/neck. Please follow these instructions carefully:  1.  Shower with CHG Soap the night before surgery and the  morning of Surgery.  2.  If you choose to wash your hair, wash your hair first as usual with your  normal  shampoo.  3.  After you shampoo, rinse your hair and body thoroughly to remove the  shampoo.                           4.  Use CHG as you would any other liquid soap.  You can apply chg directly  to the skin and wash                       Gently with a scrungie or clean washcloth.  5.  Apply the CHG Soap to your body ONLY FROM THE NECK DOWN.   Do not use on face/ open                           Wound or open sores. Avoid contact with eyes, ears mouth and genitals (private parts).                       Wash face,  Genitals (private parts) with your normal soap.             6.  Wash thoroughly, paying special attention to the area where your surgery  will be performed.  7.  Thoroughly rinse your body with warm water from the neck down.  8.  DO NOT shower/wash with your normal soap after using and rinsing off  the CHG Soap.                9.  Pat yourself dry with a clean towel.            10.  Wear clean pajamas.            11.  Place clean sheets on your bed the night of your first shower and do not  sleep with pets. Day of Surgery : Do not apply any lotions/deodorants the morning of surgery.  Please wear clean clothes to the hospital/surgery center.  FAILURE TO FOLLOW THESE INSTRUCTIONS MAY RESULT IN THE CANCELLATION OF YOUR SURGERY PATIENT SIGNATURE_________________________________  NURSE SIGNATURE__________________________________  ________________________________________________________________________

## 2022-05-04 ENCOUNTER — Encounter (HOSPITAL_COMMUNITY)
Admission: RE | Admit: 2022-05-04 | Discharge: 2022-05-04 | Disposition: A | Discharge status: Home / Self Care | Payer: Medicare Other | Source: Ambulatory Visit | Attending: Surgery | Admitting: Surgery

## 2022-05-04 ENCOUNTER — Encounter (HOSPITAL_COMMUNITY): Payer: Self-pay

## 2022-05-04 ENCOUNTER — Other Ambulatory Visit: Payer: Self-pay

## 2022-05-04 VITALS — BP 150/90 | HR 93 | Temp 98.4°F | Ht 63.0 in | Wt 196.0 lb

## 2022-05-04 DIAGNOSIS — I2510 Atherosclerotic heart disease of native coronary artery without angina pectoris: Secondary | ICD-10-CM | POA: Insufficient documentation

## 2022-05-04 DIAGNOSIS — E119 Type 2 diabetes mellitus without complications: Secondary | ICD-10-CM | POA: Diagnosis not present

## 2022-05-04 DIAGNOSIS — Z01818 Encounter for other preprocedural examination: Secondary | ICD-10-CM | POA: Diagnosis not present

## 2022-05-04 HISTORY — DX: Anxiety disorder, unspecified: F41.9

## 2022-05-04 HISTORY — DX: Depression, unspecified: F32.A

## 2022-05-04 LAB — BASIC METABOLIC PANEL WITH GFR
Anion gap: 9 (ref 5–15)
BUN: 9 mg/dL (ref 6–20)
CO2: 23 mmol/L (ref 22–32)
Calcium: 8.7 mg/dL — ABNORMAL LOW (ref 8.9–10.3)
Chloride: 103 mmol/L (ref 98–111)
Creatinine, Ser: 0.98 mg/dL (ref 0.61–1.24)
GFR, Estimated: >60 mL/min
Glucose, Bld: 314 mg/dL — ABNORMAL HIGH (ref 70–99)
Potassium: 3.7 mmol/L (ref 3.5–5.1)
Sodium: 135 mmol/L (ref 135–145)

## 2022-05-04 LAB — HEMOGLOBIN A1C
Hgb A1c MFr Bld: 8.6 % — ABNORMAL HIGH (ref 4.8–5.6)
Mean Plasma Glucose: 200.12 mg/dL

## 2022-05-04 LAB — CBC
HCT: 35.8 % — ABNORMAL LOW (ref 39.0–52.0)
Hemoglobin: 12.1 g/dL — ABNORMAL LOW (ref 13.0–17.0)
MCH: 33.1 pg (ref 26.0–34.0)
MCHC: 33.8 g/dL (ref 30.0–36.0)
MCV: 97.8 fL (ref 80.0–100.0)
Platelets: 121 10*3/uL — ABNORMAL LOW (ref 150–400)
RBC: 3.66 MIL/uL — ABNORMAL LOW (ref 4.22–5.81)
RDW: 11.7 % (ref 11.5–15.5)
WBC: 5.5 10*3/uL (ref 4.0–10.5)
nRBC: 0 % (ref 0.0–0.2)

## 2022-05-04 LAB — GLUCOSE, CAPILLARY: Glucose-Capillary: 358 mg/dL — ABNORMAL HIGH (ref 70–99)

## 2022-05-04 NOTE — Progress Notes (Signed)
Lab. Reports: Glucose blood: 314 A1C: 8.6 CBG: 358

## 2022-05-05 ENCOUNTER — Encounter (HOSPITAL_COMMUNITY): Payer: Self-pay | Admitting: Anesthesiology

## 2022-05-05 ENCOUNTER — Encounter (HOSPITAL_COMMUNITY): Payer: Self-pay | Admitting: Physician Assistant

## 2022-05-05 NOTE — Progress Notes (Signed)
Anesthesia Chart Review   Case: [id number] Date/Time: 05/11/22 0930   Procedure: UMBILICAL HERNIA REPAIR WITH MESH   Anesthesia type: General   Pre-op diagnosis:      UMBILICAL HERNIA     HEPATIC CIRRHOSIS   Location: [facility] ROOM 04 / [facility]   Surgeons: Donnie Mesa, MD       DISCUSSION:51 y.o. smoker with h/o HTN, DM II, parotid cancer status post parotidectomy (follows with ENT, no issues with dysphagia per pt), umbilical hernia, hepatic cirrhosis scheduled for above procedure 05/11/2022 with Dr. Donnie Mesa.   Pt H&P pt drinks around 3 beers per day.   Poorly controlled diabetes, non-fasting glucose 358 at PAT visit.  A1C 8.6.  Will evaluate DOS, discussed risk of cancellation.  VS: BP (!) 150/90   Pulse 93   Temp 36.9 C (Oral)   Ht '5\' 3"'$  (1.6 m)   Wt 88.9 kg   SpO2 99%   BMI 34.72 kg/m   PROVIDERS: Sonia Side., FNP is PCP    LABS:  forwarded to surgeon and PCP (all labs ordered are listed, but only abnormal results are displayed)  Labs Reviewed  HEMOGLOBIN A1C - Abnormal; Notable for the following components:      Result Value   Hgb A1c MFr Bld 8.6 (*)    All other components within normal limits  BASIC METABOLIC PANEL - Abnormal; Notable for the following components:   Glucose, Bld 314 (*)    Calcium 8.7 (*)    All other components within normal limits  CBC - Abnormal; Notable for the following components:   RBC 3.66 (*)    Hemoglobin 12.1 (*)    HCT 35.8 (*)    Platelets 121 (*)    All other components within normal limits  GLUCOSE, CAPILLARY - Abnormal; Notable for the following components:   Glucose-Capillary 358 (*)    All other components within normal limits     IMAGES:   EKG:   CV:  Past Medical History:  Diagnosis Date   Anxiety    Cancer ([place])    Depression    Diabetes mellitus without complication (HCC)    type 2   Diabetic polyneuropathy (HCC)    Elevated LFTs    Hematochezia    History of parotid cancer    Hyperlipidemia     Hypertension    Memory loss    Muscle weakness of lower extremity    Psychophysiological insomnia     Past Surgical History:  Procedure Laterality Date   INCISION AND DRAINAGE DEEP NECK ABSCESS     PAROTIDECTOMY     SKIN GRAFT      MEDICATIONS:  atorvastatin (LIPITOR) 80 MG tablet   Dulaglutide (TRULICITY) 3.90 ZE/0.9QZ SOPN   escitalopram (LEXAPRO) 20 MG tablet   gabapentin (NEURONTIN) 100 MG capsule   gabapentin (NEURONTIN) 300 MG capsule   glipiZIDE (GLUCOTROL) 10 MG tablet   naltrexone (DEPADE) 50 MG tablet   pantoprazole (PROTONIX) 40 MG tablet   sildenafil (VIAGRA) 50 MG tablet   sitaGLIPtin (JANUVIA) 100 MG tablet   No current facility-administered medications for this encounter.    Konrad Felix Ward, PA-C [facility] [phone]

## 2022-05-10 NOTE — Anesthesia Preprocedure Evaluation (Signed)
Anesthesia Evaluation    Reviewed: Allergy & Precautions, Patient's Chart, lab work & pertinent test results  History of Anesthesia Complications (+) DIFFICULT AIRWAY and history of anesthetic complications  Airway        Dental   Pulmonary Current Smoker,           Cardiovascular hypertension,      Neuro/Psych    GI/Hepatic GERD  ,(+) Cirrhosis     (-) substance abuse (ETOH 3 beers qday)  ,   Endo/Other  diabetes, Type 2  Renal/GU Lab Results      Component                Value               Date                      CREATININE               0.98                05/04/2022               K                        3.7                 05/04/2022                             Musculoskeletal negative musculoskeletal ROS (+)   Abdominal (+) + obese (BMI 34.7),   Peds  Hematology Lab Results      Component                Value               Date                      HGB                      12.1 (L)            05/04/2022                HCT                      35.8 (L)            05/04/2022                 PLT                      121 (L)             05/04/2022              Anesthesia Other Findings 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  Reproductive/Obstetrics                             Anesthesia Physical Anesthesia Plan  ASA: 3  Anesthesia Plan: General   Post-op Pain Management: Toradol IV (intra-op)*, Tylenol PO (pre-op)* and Precedex   Induction: Intravenous  PONV Risk Score and Plan: 2 and Treatment may vary due to age or medical condition, Midazolam and Ondansetron  Airway Management Planned: Video Laryngoscope Planned  and Oral ETT  Additional Equipment: None  Intra-op Plan:   Post-operative Plan: Extubation in OR  Informed Consent:     Dental advisory given  Plan Discussed with:   Anesthesia Plan Comments: (Pt on trulicity q Tuesday check  last dose)        Anesthesia Quick Evaluation

## 2022-05-11 ENCOUNTER — Ambulatory Visit (HOSPITAL_COMMUNITY): Admission: RE | Admit: 2022-05-11 | Payer: Medicare Other | Source: Home / Self Care | Admitting: Surgery

## 2022-05-11 ENCOUNTER — Encounter (HOSPITAL_COMMUNITY): Admission: RE | Payer: Self-pay | Source: Home / Self Care

## 2022-05-11 SURGERY — REPAIR, HERNIA, UMBILICAL, ADULT
Anesthesia: General

## 2022-06-29 ENCOUNTER — Encounter: Payer: Self-pay | Admitting: Family

## 2022-09-29 ENCOUNTER — Ambulatory Visit: Payer: Self-pay | Admitting: Surgery

## 2023-09-16 ENCOUNTER — Encounter (HOSPITAL_COMMUNITY): Payer: Self-pay

## 2023-09-16 ENCOUNTER — Ambulatory Visit (HOSPITAL_COMMUNITY)
Admission: EM | Admit: 2023-09-16 | Discharge: 2023-09-16 | Disposition: A | Discharge status: Home / Self Care | Payer: 59 | Attending: Family Medicine | Admitting: Family Medicine

## 2023-09-16 DIAGNOSIS — K529 Noninfective gastroenteritis and colitis, unspecified: Secondary | ICD-10-CM

## 2023-09-16 LAB — POC COVID19/FLU A&B COMBO
Covid Antigen, POC: NEGATIVE
Influenza A Antigen, POC: NEGATIVE
Influenza B Antigen, POC: NEGATIVE

## 2023-09-16 LAB — POCT FASTING CBG KUC MANUAL ENTRY: POCT Glucose (KUC): 265 mg/dL — AB (ref 70–99)

## 2023-09-16 MED ORDER — ONDANSETRON 4 MG PO TBDP: 4.0000 mg | ORAL_TABLET | Freq: Three times a day (TID) | ORAL | 0 refills | Status: DC | PRN

## 2023-09-16 MED ORDER — ONDANSETRON HCL 4 MG/2ML IJ SOLN
4.0000 mg | Freq: Once | INTRAMUSCULAR | Status: AC
  Administered 2023-09-16: 4 mg via INTRAMUSCULAR

## 2023-09-16 MED ORDER — ONDANSETRON 4 MG PO TBDP
ORAL_TABLET | ORAL | Status: AC
  Filled 2023-09-16: qty 1

## 2023-09-16 MED ORDER — ONDANSETRON HCL 4 MG/2ML IJ SOLN
INTRAMUSCULAR | Status: AC
  Filled 2023-09-16: qty 2

## 2023-09-16 NOTE — ED Triage Notes (Signed)
 Pt c/o n/v/d since yesterday. States he is a DM type 2 and hasn't had his shot this am. States can't keep anything down.

## 2023-09-16 NOTE — Discharge Instructions (Signed)
 Your flu and COVID test was negative  Ondansetron  dissolved in the mouth every 8 hours as needed for nausea or vomiting. Clear liquids(water, gatorade/pedialyte, ginger ale/sprite, chicken broth/soup) and bland things(crackers/toast, rice, potato, bananas) to eat. Avoid acidic foods like lemon/lime/orange/tomato, and avoid greasy/spicy foods.  We gave you 1 dose of ondansetron  and injection here in the clinic.  If you continue to vomit despite these treatments, please go to the emergency room for further evaluation and treatment

## 2023-09-16 NOTE — ED Provider Notes (Signed)
 [facility]    CSN: [id number] Arrival date & time: 09/16/23  0915      History   Chief Complaint Chief Complaint  Patient presents with   Emesis   Diarrhea    HPI Jimmy Chandler is a 53 y.o. male.    Emesis Associated symptoms: diarrhea   Diarrhea Associated symptoms: vomiting   Here for nausea and vomiting and diarrhea that began yesterday.  He is thrown up about 3 or 4 times and had about 10 stools that are loose in the last 24 hours. He is not sure if he has had fever.  He has had a little bit of cough.  No blood in the stool or emesis.  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 Authorizing Provider  ondansetron  (ZOFRAN -ODT) 4 MG disintegrating tablet Take 1 tablet (4 mg total) by mouth every 8 (eight) hours as needed for nausea or vomiting. 09/16/23  Yes Broden Holt K, MD  atorvastatin (LIPITOR) 80 MG tablet Take 80 mg by mouth at bedtime. 05/08/19   [provider]  Dulaglutide (TRULICITY) 0.75 MG/0.5ML SOPN Inject 0.75 mg into the skin every Tuesday.    [provider]  escitalopram (LEXAPRO) 20 MG  tablet Take 20 mg by mouth See admin instructions. Take one tablet (20 mg) by mouth every other night 03/23/22   [provider]  gabapentin (NEURONTIN) 100 MG capsule Take 200 mg by mouth 3 (three) times daily. Also take 300 mg at bedtime 04/28/22   [provider]  gabapentin (NEURONTIN) 300 MG capsule Take 300 mg by mouth at bedtime.    [provider]  glipiZIDE (GLUCOTROL) 10 MG tablet Take 10 mg by mouth 2 (two) times daily before a meal.    [provider]  naltrexone (DEPADE) 50 MG tablet Take 50 mg by mouth every morning. 03/23/22   [provider]  pantoprazole  (PROTONIX ) 40 MG tablet Take 40 mg by mouth daily.    [provider]  sildenafil (VIAGRA) 50 MG tablet Take 50 mg by mouth daily as needed for erectile dysfunction.    [provider]  sitaGLIPtin (JANUVIA) 100 MG tablet Take 100 mg by mouth daily.    [provider]    Family History Family History  Problem Relation Age of Onset   Cancer Mother    Hypertension Father     Social History Social History   Tobacco Use  Smoking status: Every Day    Types: Cigars   Smokeless tobacco: Never   Tobacco comments:    3 cigars daily  Vaping Use   Vaping status: Never Used  Substance Use Topics   Alcohol use: Yes    Alcohol/week: 14.0 standard drinks of alcohol    Types: 14 Cans of beer per week    Comment: daily   Drug use: Never     Allergies   Patient has no known allergies.   Review of Systems Review of Systems  Gastrointestinal:  Positive for diarrhea and vomiting.     Physical Exam Triage Vital Signs ED Triage Vitals  Encounter Vitals Group     BP 09/16/23 1007 135/88     Systolic BP Percentile --      Diastolic BP Percentile --      Pulse Rate 09/16/23 1007 96     Resp 09/16/23 1007 18     Temp 09/16/23 1007 98.1 F (36.7 C)     Temp Source 09/16/23 1007 Oral     SpO2 09/16/23 1007 97 %     Weight --      Height --       Head Circumference --      Peak Flow --      Pain Score 09/16/23 1008 8     Pain Loc --      Pain Education --      Exclude from Growth Chart --    No data found.  Updated Vital Signs BP 135/88 (BP Location: Left Arm)   Pulse 96   Temp 98.1 F (36.7 C) (Oral)   Resp 18   SpO2 97%   Visual Acuity Right Eye Distance:   Left Eye Distance:   Bilateral Distance:    Right Eye Near:   Left Eye Near:    Bilateral Near:     Physical Exam Vitals reviewed.  Constitutional:      General: He is not in acute distress.    Appearance: He is not toxic-appearing.  HENT:     Nose: Nose normal.     Mouth/Throat:     Mouth: Mucous membranes are moist.     Pharynx: No oropharyngeal exudate or posterior oropharyngeal erythema.  Eyes:     Extraocular Movements: Extraocular movements intact.     Conjunctiva/sclera: Conjunctivae normal.     Pupils: Pupils are equal, round, and reactive to light.  Cardiovascular:     Rate and Rhythm: Normal rate and regular rhythm.     Heart sounds: No murmur heard. Pulmonary:     Effort: No respiratory distress.     Breath sounds: No stridor. No wheezing, rhonchi or rales.  Abdominal:     General: There is no distension.     Palpations: Abdomen is soft.     Tenderness: There is no guarding.     Comments: There is mild generalized tenderness  Musculoskeletal:     Cervical back: Neck supple.  Lymphadenopathy:     Cervical: No cervical adenopathy.  Skin:    Capillary Refill: Capillary refill takes less than 2 seconds.     Coloration: Skin is not jaundiced or pale.  Neurological:     General: No focal deficit present.     Mental Status: He is alert and oriented to person, place, and time.  Psychiatric:        Behavior: Behavior normal.      UC Treatments / Results  Labs (all labs ordered are listed, but  only abnormal results are displayed) Labs Reviewed  POCT FASTING CBG [facility] MANUAL ENTRY - Abnormal; Notable for the following components:       Result Value   POCT Glucose ([facility]) 265 (*)    All other components within normal limits  POC COVID19/FLU A&B COMBO    EKG   Radiology No results found.  Procedures Procedures (including critical care time)  Medications Ordered in UC Medications  ondansetron  (ZOFRAN ) injection 4 mg (4 mg Intramuscular Given 09/16/23 1046)    Initial Impression / Assessment and Plan / UC Course  I have reviewed the triage vital signs and the nursing notes.  Pertinent labs & imaging results that were available during my care of the patient were reviewed by me and considered in my medical decision making (see chart for details).     Fingerstick glucose is 265.  Flu and COVID test is negative.  Zofran  injection is given here and Zofran  orally dissolving tablets are sent to the pharmacy.  We discussed oral hydration.  If he continues to vomit despite these therapies, he will proceed to the emergency room.   Final Clinical Impressions(s) / UC Diagnoses   Final diagnoses:  Gastroenteritis     Discharge Instructions      Your flu and COVID test was negative  Ondansetron  dissolved in the mouth every 8 hours as needed for nausea or vomiting. Clear liquids(water, gatorade/pedialyte, ginger ale/sprite, chicken broth/soup) and bland things(crackers/toast, rice, potato, bananas) to eat. Avoid acidic foods like lemon/lime/orange/tomato, and avoid greasy/spicy foods.  We gave you 1 dose of ondansetron  and injection here in the clinic.  If you continue to vomit despite these treatments, please go to the emergency room for further evaluation and treatment     ED Prescriptions     Medication Sig Dispense Auth. Provider   ondansetron  (ZOFRAN -ODT) 4 MG disintegrating tablet Take 1 tablet (4 mg total) by mouth every 8 (eight) hours as needed for nausea or vomiting. 10 tablet Vonna Hanley Woerner K, MD      PDMP not reviewed this encounter.   Vonna Sharlet POUR, MD 09/16/23 [phone]

## 2023-10-08 ENCOUNTER — Emergency Department (HOSPITAL_COMMUNITY): Payer: 59

## 2023-10-08 ENCOUNTER — Other Ambulatory Visit: Payer: Self-pay

## 2023-10-08 ENCOUNTER — Emergency Department (HOSPITAL_COMMUNITY)
Admission: EM | Admit: 2023-10-08 | Discharge: 2023-10-09 | Disposition: A | Discharge status: Home / Self Care | Payer: 59 | Attending: Emergency Medicine | Admitting: Emergency Medicine

## 2023-10-08 ENCOUNTER — Encounter (HOSPITAL_COMMUNITY): Payer: Self-pay | Admitting: *Deleted

## 2023-10-08 DIAGNOSIS — Z7984 Long term (current) use of oral hypoglycemic drugs: Secondary | ICD-10-CM | POA: Diagnosis not present

## 2023-10-08 DIAGNOSIS — R519 Headache, unspecified: Secondary | ICD-10-CM

## 2023-10-08 DIAGNOSIS — M545 Low back pain, unspecified: Secondary | ICD-10-CM | POA: Insufficient documentation

## 2023-10-08 DIAGNOSIS — E119 Type 2 diabetes mellitus without complications: Secondary | ICD-10-CM | POA: Diagnosis not present

## 2023-10-08 DIAGNOSIS — K029 Dental caries, unspecified: Secondary | ICD-10-CM | POA: Insufficient documentation

## 2023-10-08 LAB — I-STAT CHEM 8, ED
BUN: 10 mg/dL (ref 6–20)
Calcium, Ion: 1.13 mmol/L — ABNORMAL LOW (ref 1.15–1.40)
Chloride: 101 mmol/L (ref 98–111)
Creatinine, Ser: 1.1 mg/dL (ref 0.61–1.24)
Glucose, Bld: 238 mg/dL — ABNORMAL HIGH (ref 70–99)
HCT: 44 % (ref 39.0–52.0)
Hemoglobin: 15 g/dL (ref 13.0–17.0)
Potassium: 4 mmol/L (ref 3.5–5.1)
Sodium: 135 mmol/L (ref 135–145)
TCO2: 21 mmol/L — ABNORMAL LOW (ref 22–32)
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
## 2023-10-09 MED ORDER — ACETAMINOPHEN 500 MG PO TABS
1000.0000 mg | ORAL_TABLET | Freq: Once | ORAL | Status: AC
  Administered 2023-10-09: 1000 mg via ORAL
  Filled 2023-10-09: qty 2

## 2023-10-09 NOTE — Discharge Instructions (Addendum)
No broken bones today.  You will be sore but everything looks okay on the scans.  You can take Tylenol as needed for pain and try ice packs.
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
## 2023-10-09 NOTE — ED Notes (Signed)
Pt denies vision changes or dizziness. Pt is A&Ox4

## 2023-10-09 NOTE — ED Notes (Signed)
Called pt name x4 no answer.

## 2023-10-28 ENCOUNTER — Encounter (HOSPITAL_COMMUNITY): Payer: Self-pay

## 2023-10-28 ENCOUNTER — Ambulatory Visit (INDEPENDENT_AMBULATORY_CARE_PROVIDER_SITE_OTHER): Payer: 59

## 2023-10-28 ENCOUNTER — Ambulatory Visit (HOSPITAL_COMMUNITY)
Admission: EM | Admit: 2023-10-28 | Discharge: 2023-10-28 | Disposition: A | Discharge status: Home / Self Care | Payer: 59 | Attending: Emergency Medicine | Admitting: Emergency Medicine

## 2023-10-28 DIAGNOSIS — M25511 Pain in right shoulder: Secondary | ICD-10-CM

## 2023-10-28 DIAGNOSIS — R739 Hyperglycemia, unspecified: Secondary | ICD-10-CM

## 2023-10-28 DIAGNOSIS — W19XXXA Unspecified fall, initial encounter: Secondary | ICD-10-CM

## 2023-10-28 DIAGNOSIS — R112 Nausea with vomiting, unspecified: Secondary | ICD-10-CM

## 2023-10-28 LAB — POCT FASTING CBG KUC MANUAL ENTRY: POCT Glucose (KUC): 249 mg/dL — AB (ref 70–99)

## 2023-10-28 LAB — POC COVID19/FLU A&B COMBO
Covid Antigen, POC: NEGATIVE
Influenza A Antigen, POC: NEGATIVE
Influenza B Antigen, POC: NEGATIVE

## 2023-10-28 MED ORDER — ONDANSETRON 4 MG PO TBDP: 4.0000 mg | ORAL_TABLET | Freq: Three times a day (TID) | ORAL | 0 refills | Status: AC | PRN

## 2023-10-28 MED ORDER — ONDANSETRON 4 MG PO TBDP
4.0000 mg | ORAL_TABLET | Freq: Once | ORAL | Status: AC
  Administered 2023-10-28: 4 mg via ORAL

## 2023-10-28 MED ORDER — ONDANSETRON 4 MG PO TBDP
ORAL_TABLET | ORAL | Status: AC
  Filled 2023-10-28: qty 1

## 2023-10-28 MED ORDER — IBUPROFEN 800 MG PO TABS: 800.0000 mg | ORAL_TABLET | Freq: Three times a day (TID) | ORAL | 0 refills | Status: AC

## 2023-10-28 MED ORDER — IBUPROFEN 800 MG PO TABS
ORAL_TABLET | ORAL | Status: AC
  Filled 2023-10-28: qty 1

## 2023-10-28 MED ORDER — IBUPROFEN 800 MG PO TABS
800.0000 mg | ORAL_TABLET | Freq: Once | ORAL | Status: AC
  Administered 2023-10-28: 800 mg via ORAL

## 2023-10-28 MED ORDER — ACETAMINOPHEN 500 MG PO TABS: 500.0000 mg | ORAL_TABLET | Freq: Four times a day (QID) | ORAL | 0 refills | Status: AC | PRN

## 2023-10-28 NOTE — Discharge Instructions (Addendum)
Your COVID and flu testing were negative, you most likely have a different viral illness.  Ensure you are staying well-hydrated and alternating between Tylenol and ibuprofen every 4-6 hours for any fever, body aches or chills.  You can use the nausea medicine every 8 hours as needed.  If you develop recurrent vomiting please seek immediate care at the nearest emergency department.  He can wear the sling for comfort of your shoulder.  Return to clinic for any new or urgent symptoms.

## 2023-10-28 NOTE — ED Provider Notes (Signed)
 [facility]    CSN: [id number] Arrival date & time: 10/28/23  1232      History   Chief Complaint Chief Complaint  Patient presents with   Fall   Emesis    HPI Jimmy Chandler is a 53 y.o. male.   Patient presents to clinic complaining of headache, congestion, sore throat, body aches and hot and cold chills that started yesterday. He is diabetic and yesterday evening he had felt like his blood sugars were low, he fell down one stop and injured his right shoulder. Cannot lift right shoulder without pain.   Had also had nausea, emesis and diarrhea. Currently staying at a homeless shelter and has had multiple recent sick contacts.  Has history of right sided deficits, numbness and drooping after parotid surgery.  The history is provided by the patient and medical records.  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 Authorizing Provider  acetaminophen (TYLENOL) 500 MG tablet Take 1 tablet (500 mg total) by mouth every 6 (six) hours as needed. 10/28/23  Yes Rinaldo Ratel, [place] N, FNP  atorvastatin (LIPITOR) 80 MG tablet Take 80 mg  by mouth at bedtime. 05/08/19  Yes [provider]  Dulaglutide (TRULICITY) 0.75 MG/0.5ML SOPN Inject 0.75 mg into the skin every Tuesday.   Yes [provider]  escitalopram (LEXAPRO) 20 MG tablet Take 20 mg by mouth See admin instructions. Take one tablet (20 mg) by mouth every other night 03/23/22  Yes [provider]  gabapentin (NEURONTIN) 300 MG capsule Take 300 mg by mouth at bedtime.   Yes [provider]  glipiZIDE (GLUCOTROL) 10 MG tablet Take 10 mg by mouth 2 (two) times daily before a meal.   Yes [provider]  ibuprofen (ADVIL) 800 MG tablet Take 1 tablet (800 mg total) by mouth 3 (three) times daily. 10/28/23  Yes Rinaldo Ratel, [place] N, FNP  naltrexone (DEPADE) 50 MG tablet Take 50 mg by mouth every morning. 03/23/22  Yes [provider]  ondansetron (ZOFRAN-ODT) 4 MG disintegrating tablet Take 1 tablet (4 mg total) by mouth every 8 (eight) hours as needed for nausea or vomiting. 10/28/23  Yes Rinaldo Ratel, [place] N, FNP  pantoprazole (PROTONIX) 40 MG tablet Take 40 mg by mouth daily.   Yes [provider]  sildenafil (VIAGRA) 50 MG tablet Take 50 mg by mouth daily as needed for erectile dysfunction.   Yes [provider]  sitaGLIPtin (JANUVIA)  100 MG tablet Take 100 mg by mouth daily.   Yes [provider]    Family History Family History  Problem Relation Age of Onset   Cancer Mother    Hypertension Father     Social History Social History   Tobacco Use   Smoking status: Every Day    Types: Cigars   Smokeless tobacco: Never   Tobacco comments:    3 cigars daily  Vaping Use   Vaping status: Never Used  Substance Use Topics   Alcohol use: Yes    Alcohol/week: 14.0 standard drinks of alcohol    Types: 14 Cans of beer per week    Comment: daily   Drug use: Never     Allergies   Patient has no known allergies.   Review of Systems Review of Systems  Per HPI   Physical Exam Triage  Vital Signs ED Triage Vitals  Encounter Vitals Group     BP 10/28/23 1317 (!) 157/79     Systolic BP Percentile --      Diastolic BP Percentile --      Pulse Rate 10/28/23 1317 (!) 106     Resp 10/28/23 1317 18     Temp 10/28/23 1317 99.1 F (37.3 C)     Temp Source 10/28/23 1317 Oral     SpO2 10/28/23 1317 95 %     Weight --      Height 10/28/23 1316 5\' 3"  (1.6 m)     Head Circumference --      Peak Flow --      Pain Score 10/28/23 1314 9     Pain Loc --      Pain Education --      Exclude from Growth Chart --    No data found.  Updated Vital Signs BP (!) 157/79 (BP Location: Left Arm)   Pulse (!) 106   Temp 99.1 F (37.3 C) (Oral)   Resp 18   Ht 5\' 3"  (1.6 m)   SpO2 95%   BMI 34.72 kg/m   Visual Acuity Right Eye Distance:   Left Eye Distance:   Bilateral Distance:    Right Eye Near:   Left Eye Near:    Bilateral Near:     Physical Exam Vitals and nursing note reviewed.  Constitutional:      Appearance: Normal appearance.  HENT:     Head: Normocephalic and atraumatic.     Right Ear: External ear normal.     Left Ear: External ear normal.     Nose: Nose normal.     Mouth/Throat:     Mouth: Mucous membranes are moist.  Eyes:     Conjunctiva/sclera: Conjunctivae normal.  Cardiovascular:     Rate and Rhythm: Normal rate and regular rhythm.     Heart sounds: Normal heart sounds. No murmur heard. Pulmonary:     Effort: Pulmonary effort is normal. No respiratory distress.  Musculoskeletal:        General: Normal range of motion.  Skin:    General: Skin is warm and dry.  Neurological:     Mental Status: He is alert. Mental status is at baseline.  Psychiatric:        Mood and Affect: Mood normal.      UC Treatments / Results  Labs (all labs ordered are listed, but only abnormal results are displayed) Labs Reviewed  POCT FASTING CBG [facility] MANUAL ENTRY - Abnormal; Notable for the following components:  Result Value   POCT Glucose ([facility]) 249 (*)     All other components within normal limits  POC COVID19/FLU A&B COMBO    EKG   Radiology DG Shoulder Right Result Date: 10/28/2023 CLINICAL DATA:  Fall and right shoulder pain. EXAM: RIGHT SHOULDER - 2+ VIEW COMPARISON:  None Available. FINDINGS: Right shoulder is located without a fracture. Alignment at the right [facility] joint appears to be within normal limits. Mild degenerative changes at the right [facility] joint. Visualized right ribs are intact. IMPRESSION: No acute bone abnormality to the right shoulder. Electronically Signed   By: Richarda Overlie M.D.   On: 10/28/2023 16:19    Procedures Procedures (including critical care time)  Medications Ordered in UC Medications  ibuprofen (ADVIL) tablet 800 mg (800 mg Oral Given 10/28/23 1348)  ondansetron (ZOFRAN-ODT) disintegrating tablet 4 mg (4 mg Oral Given 10/28/23 1349)    Initial Impression / Assessment and Plan / UC Course  I have reviewed the triage vital signs and the nursing notes.  Pertinent labs & imaging results that were available during my care of the patient were reviewed by me and considered in my medical decision making (see chart for details).  Vitals and triage reviewed, patient is hemodynamically stable.  Lungs are vesicular, heart with regular rate and rhythm.  CBG 249, low concern for hypoglycemia.  POC COVID and flu testing negative, suspect other viral URI causing symptoms.  Tolerating p.o. fluids after Zofran.  Ibuprofen given in clinic for right shoulder pain.  Imaging shows no acute bony abnormality, provided with sling for support and comfort.  Viral symptom management and shoulder pain discussed, no questions at this time.  Plan of care, follow-up care return precautions given, no questions at this time.     Final Clinical Impressions(s) / UC Diagnoses   Final diagnoses:  Acute pain of right shoulder  Nausea and vomiting, unspecified vomiting type  Fall, initial encounter  Hyperglycemia     Discharge Instructions       Your COVID and flu testing were negative, you most likely have a different viral illness.  Ensure you are staying well-hydrated and alternating between Tylenol and ibuprofen every 4-6 hours for any fever, body aches or chills.  You can use the nausea medicine every 8 hours as needed.  If you develop recurrent vomiting please seek immediate care at the nearest emergency department.  He can wear the sling for comfort of your shoulder.  Return to clinic for any new or urgent symptoms.     ED Prescriptions     Medication Sig Dispense Auth. Provider   acetaminophen (TYLENOL) 500 MG tablet Take 1 tablet (500 mg total) by mouth every 6 (six) hours as needed. 30 tablet Rinaldo Ratel, [place] N, [place]   ibuprofen (ADVIL) 800 MG tablet Take 1 tablet (800 mg total) by mouth 3 (three) times daily. 21 tablet Rinaldo Ratel, [place] N, FNP   ondansetron (ZOFRAN-ODT) 4 MG disintegrating tablet Take 1 tablet (4 mg total) by mouth every 8 (eight) hours as needed for nausea or vomiting. 20 tablet Caytlin Better, [place] N, [place]      PDMP not reviewed this encounter.   Breylan Lefevers, [place] N, [place] 10/28/23 2009

## 2023-10-28 NOTE — ED Triage Notes (Signed)
Chief Complaint: Fall= Patient is diabetic and states he feels like his sugar was low. Patient felt weak and fell down 1 stair. Patient hit his right arm and leg. Favoring/guarding the right side of the body. Emesis=  Patient able to keep down solids and liquids at times. Patient has had diarrhea as well.  Sick exposure: Yes- Patient stays in a homeless shelter.   Onset: Fall = yesterday. Emesis/diarrhea= yesterday  Prescriptions or OTC medications tried: No    New foods, medications, or products: No  Recent Travel: No

## 2023-10-29 IMAGING — CT CT CHEST-ABD-PELV W/ CM
2 of 6 series · 15 of 36 positions shown, 17 images · IV contrast (agent unspecified)
Comparison: None.

CLINICAL DATA: Weight loss, unintended.  Nausea, vomiting

EXAM:
CT CHEST, ABDOMEN, AND PELVIS WITH CONTRAST
TECHNIQUE: Multidetector CT imaging of the chest, abdomen and pelvis was
performed following the standard protocol during bolus
administration of intravenous contrast.

[Series 1: coronals · coronal · 1.04mm/px · 3 of 466 slices shown]
[im 94/466  mediastinal]
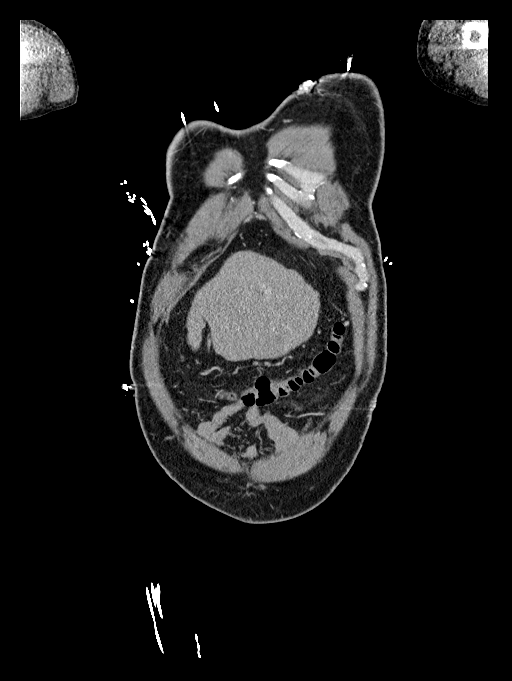
[im 187/466  mediastinal]
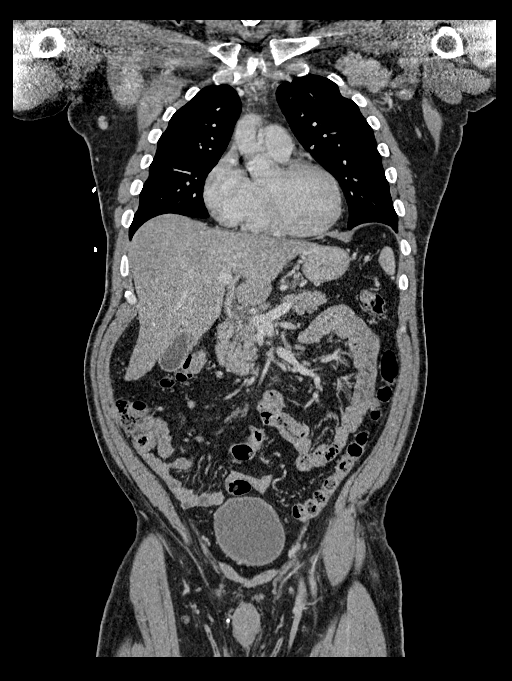
[im 280/466  mediastinal]
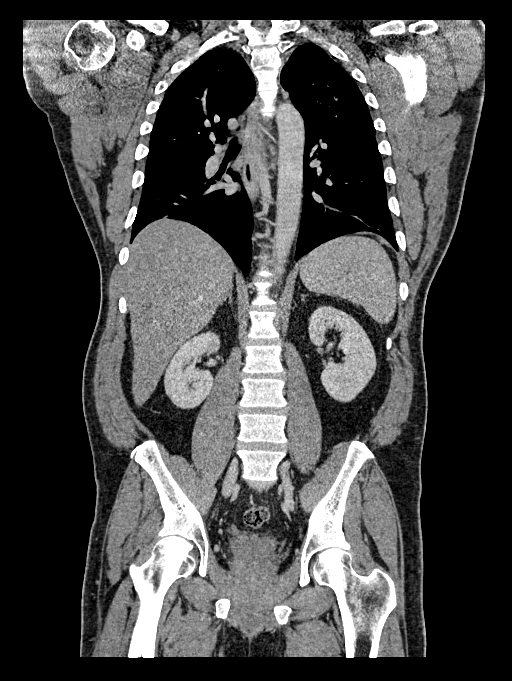

[Series 7: thins · axial · 0.98mm/px · z∈[-836,-242]mm · 12 of 944 slices shown, 14 images]
[im 48/944  mediastinal]
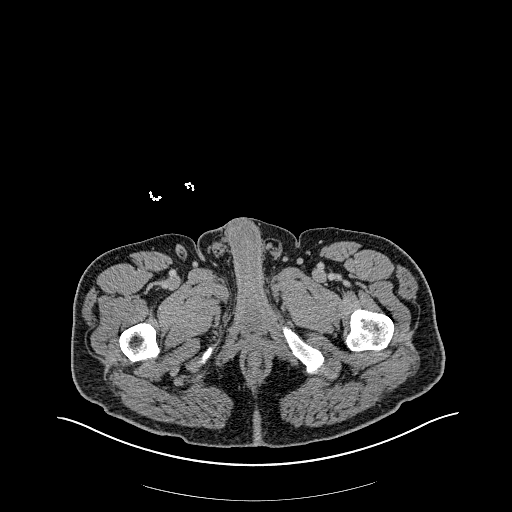
[im 48/944  bone]
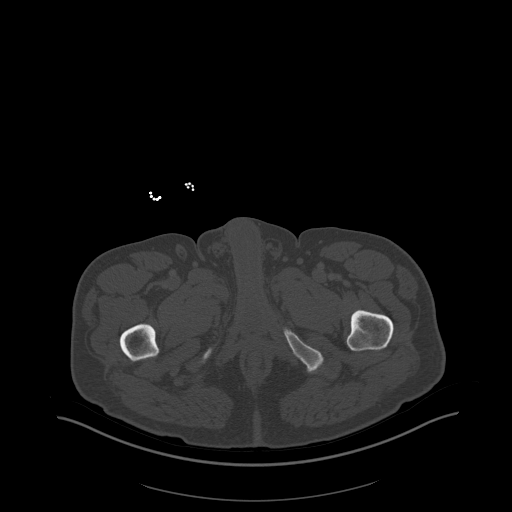
[im 142/944  mediastinal]
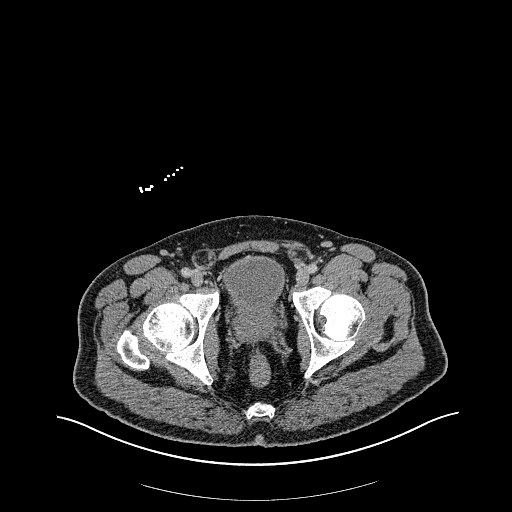
[im 189/944  mediastinal]
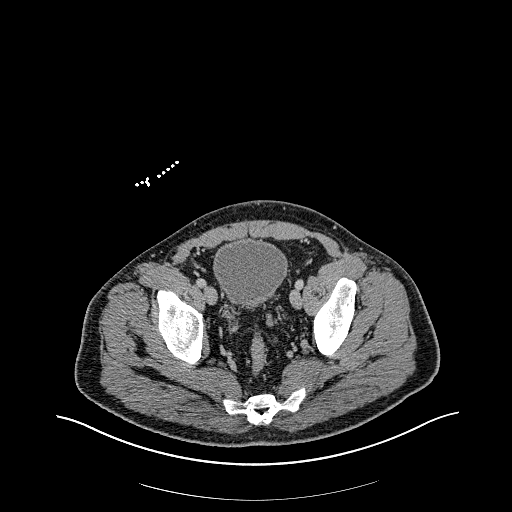
[im 283/944  mediastinal]
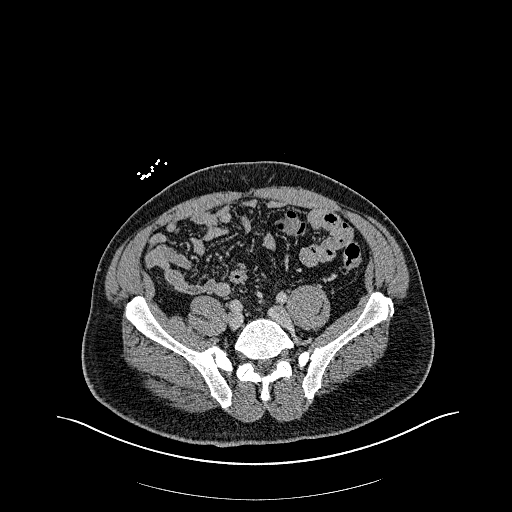
[im 378/944  mediastinal]
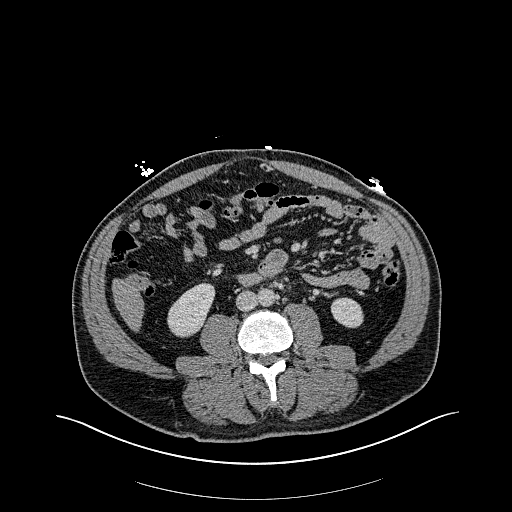
[im 425/944  mediastinal]
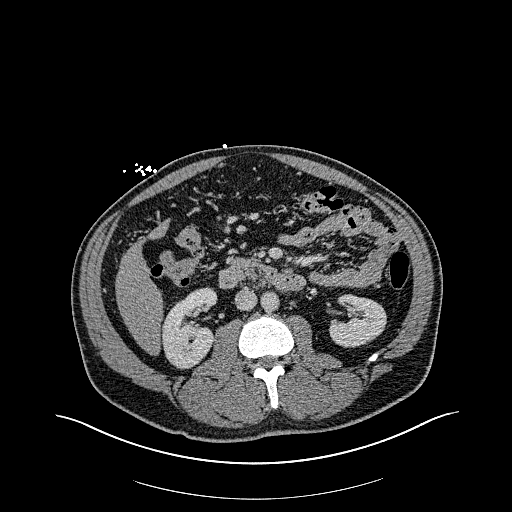
[im 519/944  mediastinal]
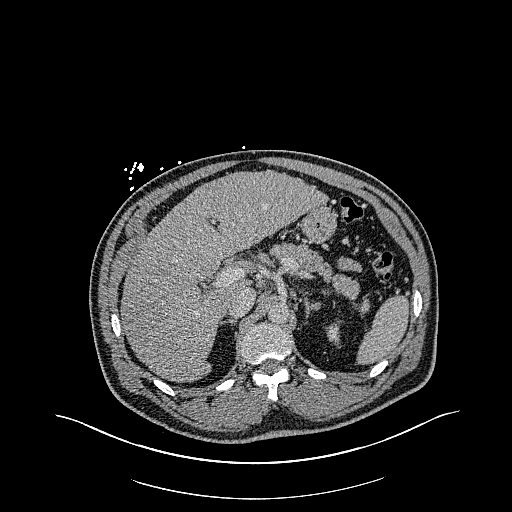
[im 566/944  mediastinal]
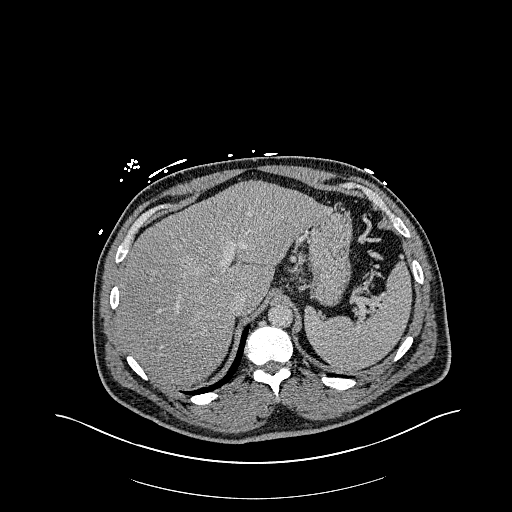
[im 661/944  mediastinal]
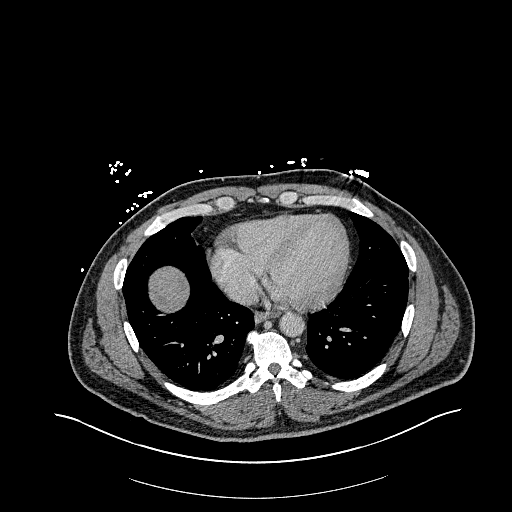
[im 661/944  bone]
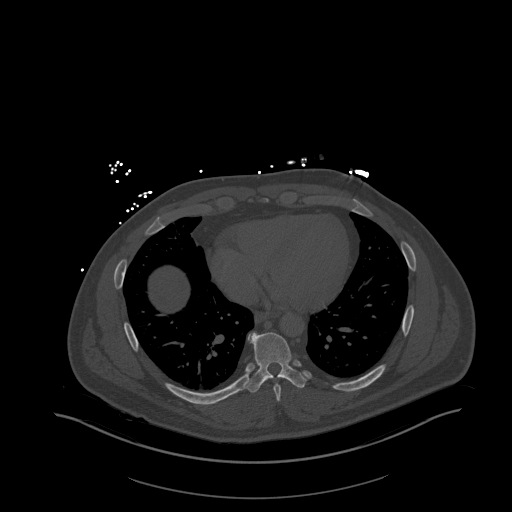
[im 755/944  mediastinal]
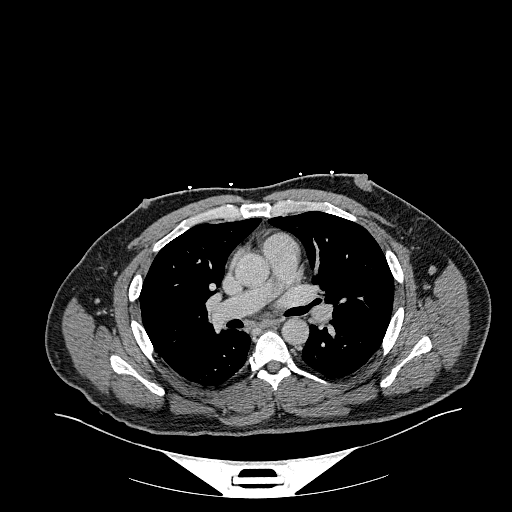
[im 802/944  mediastinal]
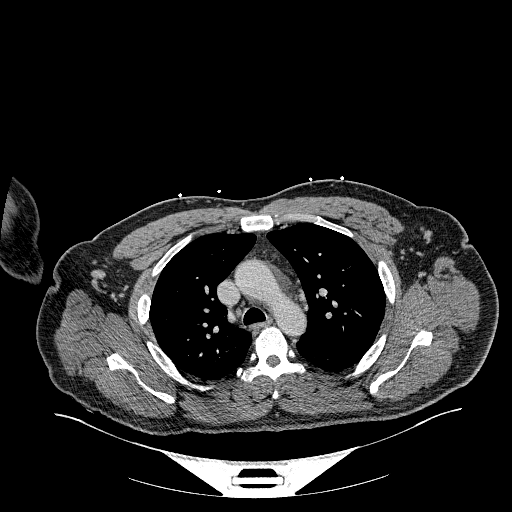
[im 896/944  mediastinal]
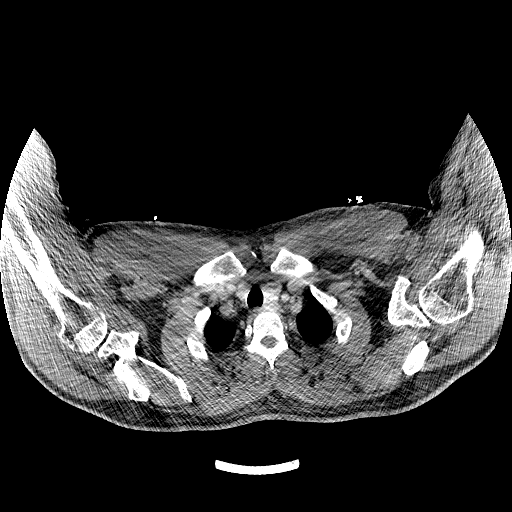

[15 of 36 positions shown; findings below may reference images not displayed]

RADIATION DOSE REDUCTION: This exam was performed according to the
departmental dose-optimization program which includes automated
exposure control, adjustment of the mA and/or kV according to
patient size and/or use of iterative reconstruction technique.

CONTRAST:  100mL OMNIPAQUE IOHEXOL 300 MG/ML  SOLN
FINDINGS: CT CHEST FINDINGS

Cardiovascular: Heart is normal size. Aorta is normal caliber.

Mediastinum/Nodes: No mediastinal, hilar, or axillary adenopathy.
Trachea and esophagus are unremarkable. Thyroid unremarkable.

Lungs/Pleura: Lungs are clear. No focal airspace opacities or
suspicious nodules. No effusions.

Musculoskeletal: Chest wall soft tissues are unremarkable. No acute
bony abnormality. No focal bone lesions.

CT ABDOMEN PELVIS FINDINGS

Hepatobiliary: 2.7 cm low-density lesion in the right hepatic lobe
with peripheral contrast puddling most compatible with hemangioma.
No suspicious focal hepatic abnormality. Subtle nodularity to the
liver contours suggest cirrhosis. This is unchanged. Gallbladder
unremarkable.

Pancreas: No focal abnormality or ductal dilatation.

Spleen: No focal abnormality.  Normal size.

Adrenals/Urinary Tract: No adrenal abnormality. No focal renal
abnormality. No stones or hydronephrosis. Urinary bladder is
unremarkable.

Stomach/Bowel: Stomach, large and small bowel grossly unremarkable.

Vascular/Lymphatic: No evidence of aneurysm or adenopathy.

Reproductive: No visible focal abnormality.

Other: No free fluid or free air.

Musculoskeletal: No acute bony abnormality.  No focal bone lesion.
IMPRESSION: No acute findings in the chest, abdomen or pelvis. No explanation
for the patient's unintended weight loss.

Changes of cirrhosis. Lesion within the right hepatic lobe
demonstrates peripheral puddling of contrast most compatible with
hemangioma, stable since prior study.
